# Patient Record
Sex: Female | Born: 1965 | Race: Black or African American | Hispanic: No | Marital: Single | Smoking: Never smoker
Health system: Southern US, Community
[De-identification: ages and names within clinical notes are randomized; demographics above are authoritative.]

## PROBLEM LIST (undated history)

## (undated) DIAGNOSIS — I10 Essential (primary) hypertension: Secondary | ICD-10-CM

---

## 1989-11-07 HISTORY — PX: BREAST LUMPECTOMY: SHX2

## 2002-11-07 HISTORY — PX: UTERINE FIBROID SURGERY: SHX826

## 2019-04-16 ENCOUNTER — Other Ambulatory Visit: Payer: Self-pay

## 2019-04-16 ENCOUNTER — Ambulatory Visit (HOSPITAL_COMMUNITY)
Admission: EM | Admit: 2019-04-16 | Discharge: 2019-04-16 | Disposition: A | Discharge status: Home / Self Care | Payer: Self-pay | Attending: Internal Medicine | Admitting: Internal Medicine

## 2019-04-16 ENCOUNTER — Encounter (HOSPITAL_COMMUNITY): Payer: Self-pay | Admitting: Emergency Medicine

## 2019-04-16 DIAGNOSIS — R42 Dizziness and giddiness: Secondary | ICD-10-CM | POA: Insufficient documentation

## 2019-04-16 DIAGNOSIS — J302 Other seasonal allergic rhinitis: Secondary | ICD-10-CM | POA: Insufficient documentation

## 2019-04-16 HISTORY — DX: Essential (primary) hypertension: I10

## 2019-04-16 LAB — CBC
HCT: 40.5 % (ref 36.0–46.0)
Hemoglobin: 13.9 g/dL (ref 12.0–15.0)
MCH: 30.6 pg (ref 26.0–34.0)
MCHC: 34.3 g/dL (ref 30.0–36.0)
MCV: 89.2 fL (ref 80.0–100.0)
Platelets: 231 10*3/uL (ref 150–400)
RBC: 4.54 MIL/uL (ref 3.87–5.11)
RDW: 13 % (ref 11.5–15.5)
WBC: 6.3 10*3/uL (ref 4.0–10.5)
nRBC: 0 % (ref 0.0–0.2)

## 2019-04-16 LAB — BASIC METABOLIC PANEL
Anion gap: 10 (ref 5–15)
BUN: 10 mg/dL (ref 6–20)
CO2: 22 mmol/L (ref 22–32)
Calcium: 9.4 mg/dL (ref 8.9–10.3)
Chloride: 107 mmol/L (ref 98–111)
Creatinine, Ser: 0.86 mg/dL (ref 0.44–1.00)
GFR calc Af Amer: >60 mL/min (ref >60)
GFR calc non Af Amer: >60 mL/min (ref >60)
Glucose, Bld: 99 mg/dL (ref 70–99)
Potassium: 3.7 mmol/L (ref 3.5–5.1)
Sodium: 139 mmol/L (ref 135–145)

## 2019-04-16 LAB — TSH: TSH: 1.392 u[IU]/mL (ref 0.350–4.500)

## 2019-04-16 MED ORDER — MONTELUKAST SODIUM 10 MG PO TABS: 10.0000 mg | ORAL_TABLET | Freq: Every day | ORAL | 0 refills | Status: DC

## 2019-04-16 MED ORDER — GUAIFENESIN ER 600 MG PO TB12: 600.0000 mg | ORAL_TABLET | Freq: Two times a day (BID) | ORAL | 0 refills | Status: DC

## 2019-04-16 MED ORDER — FLUTICASONE PROPIONATE 50 MCG/ACT NA SUSP: 2.0000 | Freq: Every day | NASAL | 2 refills | Status: DC

## 2019-04-16 NOTE — ED Provider Notes (Signed)
[facility]    CSN: [id number] Arrival date & time: 04/16/19  1706     History   Chief Complaint Chief Complaint  Patient presents with  . Nasal Congestion    HPI Lori Acevedo is a 53 y.o. female history of hypertension comes to urgent care with complaints of increasing nasal congestion, fullness in both ears of several months duration.  Patient says her symptoms started several months ago and she seen different providers occluding ENT.  Patient has tried Flonase, course of antibiotics with no improvement in her symptoms.  She comes to urgent care today because of worsening symptoms over the past few days and is associated with some unsteadiness and dizziness.  No fever or chills.  No headaches.  No hearing problems.  Patient does not feel like she is spinning in the room or the room is spinning around her.  No nausea or vomiting.   Past Medical History:  Diagnosis Date  . Hypertension     There are no active problems to display for this patient.   History reviewed. No pertinent surgical history.  OB History   No obstetric history on file.      Home Medications    Prior to Admission medications   Medication Sig Start Date End Date Taking? Authorizing Provider  losartan (COZAAR) 25 MG tablet Take 25 mg by mouth daily.   Yes [provider]  fluticasone (FLONASE) 50 MCG/ACT nasal spray Place 2 sprays into both nostrils daily. 04/16/19   Merrilee JanskyLamptey, Euline Kimbler O, MD  guaiFENesin (MUCINEX) 600 MG 12 hr tablet Take 1 tablet (600 mg total) by mouth 2 (two) times daily. 04/16/19   Zoiey Christy, Britta MccreedyPhilip O, MD  montelukast (SINGULAIR) 10 MG tablet Take 1 tablet (10 mg total) by mouth at bedtime. 04/16/19   Norva Bowe, Britta MccreedyPhilip O, MD    Family History History reviewed. No pertinent family history.  Social History Social History   Tobacco Use  . Smoking status: Never Smoker  . Smokeless tobacco: Never Used  Substance Use Topics  . Alcohol use: Not Currently  . Drug  use: Never     Allergies   Patient has no known allergies.   Review of Systems Review of Systems   Physical Exam Triage Vital Signs ED Triage Vitals [04/16/19 1748]  Enc Vitals Group     BP 140/84     Pulse Rate 100     Resp 18     Temp 98.6 F (37 C)     Temp Source Oral     SpO2 100 %     Weight      Height      Head Circumference      Peak Flow      Pain Score 5     Pain Loc      Pain Edu?      Excl. in GC?    No data found.  Updated Vital Signs BP 140/84 (BP Location: Right Arm)   Pulse 100   Temp 98.6 F (37 C) (Oral)   Resp 18   SpO2 100%   Visual Acuity Right Eye Distance:   Left Eye Distance:   Bilateral Distance:    Right Eye Near:   Left Eye Near:    Bilateral Near:     Physical Exam Constitutional:      Appearance: Normal appearance. She is not ill-appearing.  HENT:     Right Ear: Tympanic membrane normal.     Left Ear: Tympanic membrane  normal.     Nose: Congestion present.     Mouth/Throat:     Pharynx: No oropharyngeal exudate or posterior oropharyngeal erythema.  Eyes:     Conjunctiva/sclera: Conjunctivae normal.  Neck:     Musculoskeletal: Normal range of motion.  Cardiovascular:     Rate and Rhythm: Normal rate and regular rhythm.     Pulses: Normal pulses.     Heart sounds: Normal heart sounds.  Pulmonary:     Effort: Pulmonary effort is normal.     Breath sounds: Normal breath sounds.  Abdominal:     General: Bowel sounds are normal. There is no distension.     Palpations: Abdomen is soft.     Tenderness: There is no abdominal tenderness. There is no guarding.  Musculoskeletal: Normal range of motion.        General: No swelling or deformity.  Skin:    General: Skin is warm and dry.     Capillary Refill: Capillary refill takes less than 2 seconds.     Coloration: Skin is not jaundiced.     Findings: No bruising.  Neurological:     General: No focal deficit present.     Mental Status: She is alert.  Psychiatric:         Mood and Affect: Mood normal.      UC Treatments / Results  Labs (all labs ordered are listed, but only abnormal results are displayed) Labs Reviewed  CBC  BASIC METABOLIC PANEL  TSH  VITAMIN D 25 HYDROXY (VIT D DEFICIENCY, FRACTURES)    EKG None  Radiology No results found.  Procedures Procedures (including critical care time)  Medications Ordered in UC Medications - No data to display  Initial Impression / Assessment and Plan / UC Course  I have reviewed the triage vital signs and the nursing notes.  Pertinent labs & imaging results that were available during my care of the patient were reviewed by me and considered in my medical decision making (see chart for details).     1.  Allergic rhinitis with vestibular dizziness: EKG showed normal sinus rhythm CBC, TSH were unremarkable Vitamin D levels are pending Patient is prescribed guaifenesin to help with nasal congestion Singulair, Flonase. Final Clinical Impressions(s) / UC Diagnoses   Final diagnoses:  Seasonal allergic rhinitis, unspecified trigger  Vestibular dizziness involving left inner ear   Discharge Instructions   None    ED Prescriptions    Medication Sig Dispense Auth. Provider   guaiFENesin (MUCINEX) 600 MG 12 hr tablet  (Status: Discontinued) Take 1 tablet (600 mg total) by mouth 2 (two) times daily. 30 tablet Eddison Searls, Myrene Galas, MD   fluticasone (FLONASE) 50 MCG/ACT nasal spray  (Status: Discontinued) Place 2 sprays into both nostrils daily. 16 g Alveta Quintela, Myrene Galas, MD   montelukast (SINGULAIR) 10 MG tablet  (Status: Discontinued) Take 1 tablet (10 mg total) by mouth at bedtime. 30 tablet Vianny Schraeder, Myrene Galas, MD   fluticasone (FLONASE) 50 MCG/ACT nasal spray Place 2 sprays into both nostrils daily. 16 g Chase Picket, MD   guaiFENesin (MUCINEX) 600 MG 12 hr tablet Take 1 tablet (600 mg total) by mouth 2 (two) times daily. 30 tablet Ancelmo Hunt, Myrene Galas, MD   montelukast (SINGULAIR) 10 MG  tablet Take 1 tablet (10 mg total) by mouth at bedtime. 30 tablet Tien Spooner, Myrene Galas, MD     Controlled Substance Prescriptions [place] Controlled Substance Registry consulted? No   Chase Picket, MD 04/16/19 [phone]

## 2019-04-16 NOTE — ED Triage Notes (Signed)
Pt sts sinus congestion and ear pain x months worse over last several days with some generalized weakness

## 2019-04-17 ENCOUNTER — Telehealth (HOSPITAL_COMMUNITY): Payer: Self-pay | Admitting: Emergency Medicine

## 2019-04-17 LAB — VITAMIN D 25 HYDROXY (VIT D DEFICIENCY, FRACTURES): Vit D, 25-Hydroxy: 26.4 ng/mL — ABNORMAL LOW (ref 30.0–100.0)

## 2019-04-17 NOTE — Telephone Encounter (Signed)
Normal labs, per dr. Lanny Acevedo pt okay to take otc vit d supplements, otherwise normal labs. Patient contacted and made aware of all results, all questions answered.

## 2019-12-30 ENCOUNTER — Encounter (INDEPENDENT_AMBULATORY_CARE_PROVIDER_SITE_OTHER): Payer: Self-pay | Admitting: Otolaryngology

## 2019-12-30 ENCOUNTER — Ambulatory Visit (INDEPENDENT_AMBULATORY_CARE_PROVIDER_SITE_OTHER): Payer: 59 | Admitting: Otolaryngology

## 2019-12-30 ENCOUNTER — Other Ambulatory Visit: Payer: Self-pay

## 2019-12-30 VITALS — Temp 97.7°F

## 2019-12-30 DIAGNOSIS — J3089 Other allergic rhinitis: Secondary | ICD-10-CM | POA: Diagnosis not present

## 2019-12-30 DIAGNOSIS — J342 Deviated nasal septum: Secondary | ICD-10-CM

## 2019-12-30 NOTE — Progress Notes (Signed)
HPI: Lori Acevedo is a 54 y.o. female who presents for evaluation of chronic sinus throat and ear complaints.  She complains of intermittent nasal congestion with mostly clear mucus discharge.  She is also complained of ear pressure as well as occasional throat symptoms.  The symptoms first started a year ago in May after the patient moved here from [place].  She did not have trouble with her nose and sinuses in [place]. She saw an allergist 2 weeks ago who prescribed Flonase, azelastine, Singulair and levocetirizine.  She apparently tested positive to several types of trees as well as fungus. She has previously seen ENT for sinus problems last year who recommended CT scan of the sinuses but she did not have insurance at that time.  Past Medical History:  Diagnosis Date  . Hypertension    No past surgical history on file. Social History   Socioeconomic History  . Marital status: Single    Spouse name: Not on file  . Number of children: Not on file  . Years of education: Not on file  . Highest education level: Not on file  Occupational History  . Not on file  Tobacco Use  . Smoking status: Never Smoker  . Smokeless tobacco: Never Used  Substance and Sexual Activity  . Alcohol use: Not Currently  . Drug use: Never  . Sexual activity: Not on file  Other Topics Concern  . Not on file  Social History Narrative  . Not on file   Social Determinants of Health   Financial Resource Strain:   . Difficulty of Paying Living Expenses: Not on file  Food Insecurity:   . Worried About Charity fundraiser in the Last Year: Not on file  . Ran Out of Food in the Last Year: Not on file  Transportation Needs:   . Lack of Transportation (Medical): Not on file  . Lack of Transportation (Non-Medical): Not on file  Physical Activity:   . Days of Exercise per Week: Not on file  . Minutes of Exercise per Session: Not on file  Stress:   . Feeling of Stress : Not on file  Social  Connections:   . Frequency of Communication with Friends and Family: Not on file  . Frequency of Social Gatherings with Friends and Family: Not on file  . Attends Religious Services: Not on file  . Active Member of Clubs or Organizations: Not on file  . Attends Archivist Meetings: Not on file  . Marital Status: Not on file   No family history on file. No Known Allergies Prior to Admission medications   Medication Sig Start Date End Date Taking? Authorizing Provider  fluticasone (FLONASE) 50 MCG/ACT nasal spray Place 2 sprays into both nostrils daily. 04/16/19   Chase Picket, MD  guaiFENesin (MUCINEX) 600 MG 12 hr tablet Take 1 tablet (600 mg total) by mouth 2 (two) times daily. 04/16/19   Chase Picket, MD  losartan (COZAAR) 25 MG tablet Take 25 mg by mouth daily.    [provider]  montelukast (SINGULAIR) 10 MG tablet Take 1 tablet (10 mg total) by mouth at bedtime. 04/16/19   LampteyMyrene Galas, MD     Positive ROS: Otherwise negative  All other systems have been reviewed and were otherwise negative with the exception of those mentioned in the HPI and as above.  Physical Exam: Constitutional: Alert, well-appearing, no acute distress Ears: External ears without lesions or tenderness. Ear canals are  clear bilaterally with intact, clear TMs.  Nasal: External nose without lesions. Septum with mild to moderate deflection..  Moderate mucosal swelling bilaterally right side worse than left today.  After decongesting the nose no nasal polyps noted.  Both middle meatus regions were clear. Oral: Lips and gums without lesions. Tongue and palate mucosa without lesions. Posterior oropharynx clear. Neck: No palpable adenopathy or masses Respiratory: Breathing comfortably  Skin: No facial/neck lesions or rash noted.  Procedures  Assessment: Chronic rhinitis Allergic rhinitis Septal deviation with turbinate per trophy.  Plan: She will continue with the prescribed  medications by the allergist. We will plan on scheduling a CT scan of the sinuses and have her follow-up following the CT scan of the sinuses to review this and possibly discuss surgical options.  Narda Bonds, MD

## 2019-12-31 ENCOUNTER — Other Ambulatory Visit (INDEPENDENT_AMBULATORY_CARE_PROVIDER_SITE_OTHER): Payer: Self-pay

## 2019-12-31 DIAGNOSIS — J329 Chronic sinusitis, unspecified: Secondary | ICD-10-CM

## 2020-01-16 ENCOUNTER — Other Ambulatory Visit: Payer: Self-pay

## 2020-01-16 ENCOUNTER — Ambulatory Visit
Admission: RE | Admit: 2020-01-16 | Discharge: 2020-01-16 | Disposition: A | Discharge status: Home / Self Care | Payer: 59 | Source: Ambulatory Visit | Attending: Otolaryngology | Admitting: Otolaryngology

## 2020-01-16 DIAGNOSIS — J329 Chronic sinusitis, unspecified: Secondary | ICD-10-CM

## 2020-01-30 ENCOUNTER — Ambulatory Visit (INDEPENDENT_AMBULATORY_CARE_PROVIDER_SITE_OTHER): Payer: 59 | Admitting: Otolaryngology

## 2020-01-30 ENCOUNTER — Other Ambulatory Visit: Payer: Self-pay

## 2020-01-30 VITALS — Temp 98.1°F

## 2020-01-30 DIAGNOSIS — J310 Chronic rhinitis: Secondary | ICD-10-CM

## 2020-01-30 NOTE — Progress Notes (Signed)
HPI: Lori Acevedo is a 54 y.o. female who returns today for evaluation of sinus complaints.  She complains mostly of pressure around her nose.  She presents today having had a CT scan of her sinuses.  I reviewed this with her in the office today and this showed clear paranasal sinuses with no significant sinus disease.  She does have history of allergies.  And uses Flonase and Singulair..  Past Medical History:  Diagnosis Date  . Hypertension    No past surgical history on file. Social History   Socioeconomic History  . Marital status: Single    Spouse name: Not on file  . Number of children: Not on file  . Years of education: Not on file  . Highest education level: Not on file  Occupational History  . Not on file  Tobacco Use  . Smoking status: Never Smoker  . Smokeless tobacco: Never Used  Substance and Sexual Activity  . Alcohol use: Not Currently  . Drug use: Never  . Sexual activity: Not on file  Other Topics Concern  . Not on file  Social History Narrative  . Not on file   Social Determinants of Health   Financial Resource Strain:   . Difficulty of Paying Living Expenses:   Food Insecurity:   . Worried About Charity fundraiser in the Last Year:   . Arboriculturist in the Last Year:   Transportation Needs:   . Film/video editor (Medical):   Marland Kitchen Lack of Transportation (Non-Medical):   Physical Activity:   . Days of Exercise per Week:   . Minutes of Exercise per Session:   Stress:   . Feeling of Stress :   Social Connections:   . Frequency of Communication with Friends and Family:   . Frequency of Social Gatherings with Friends and Family:   . Attends Religious Services:   . Active Member of Clubs or Organizations:   . Attends Archivist Meetings:   Marland Kitchen Marital Status:    No family history on file. No Known Allergies Prior to Admission medications   Medication Sig Start Date End Date Taking? Authorizing Provider  fluticasone (FLONASE) 50  MCG/ACT nasal spray Place 2 sprays into both nostrils daily. 04/16/19  Yes Lamptey, Myrene Galas, MD  guaiFENesin (MUCINEX) 600 MG 12 hr tablet Take 1 tablet (600 mg total) by mouth 2 (two) times daily. 04/16/19  Yes Lamptey, Myrene Galas, MD  losartan (COZAAR) 25 MG tablet Take 25 mg by mouth daily.   Yes [provider]  montelukast (SINGULAIR) 10 MG tablet Take 1 tablet (10 mg total) by mouth at bedtime. 04/16/19  Yes Lamptey, Myrene Galas, MD     Positive ROS: Otherwise negative  All other systems have been reviewed and were otherwise negative with the exception of those mentioned in the HPI and as above.  Physical Exam: Constitutional: Alert, well-appearing, no acute distress Ears: External ears without lesions or tenderness. Ear canals are clear bilaterally with intact, clear TMs bilaterally. Nasal: External nose without lesions. Septum midline..  She has mild to moderate rhinitis with clear mucus discharge within the nasal cavity moderate swelling.  Both middle meatus regions were clear with no evidence of purulent discharge. Oral: Lips and gums without lesions.  She has some minimal yellow coating on her dorsum of the tongue.  She complains that this sometimes feels like sandpaper.. Posterior oropharynx clear.  Palate mucosa was clear. Neck: No palpable adenopathy or masses Respiratory: Breathing  comfortably  Skin: No facial/neck lesions or rash noted.  Procedures  Assessment: Chronic rhinitis.  No evidence of sinusitis on clinical exam or CT scan.  Plan: Recommended continue use of her allergy medication as well as regular use of Flonase 2 sprays each nostril at night.  Saline rinse as needed. For the "dry" tongue complaints recommended oral hygiene with gargling with Listerine or scope and eating yogurt 2 or 3 times a week as this helps with normal oral flora.  Presently no evidence of active infection.   Narda Bonds, MD

## 2020-08-20 ENCOUNTER — Ambulatory Visit (INDEPENDENT_AMBULATORY_CARE_PROVIDER_SITE_OTHER): Payer: 59 | Admitting: Nurse Practitioner

## 2020-08-20 ENCOUNTER — Other Ambulatory Visit: Payer: Self-pay

## 2020-08-20 ENCOUNTER — Encounter: Payer: Self-pay | Admitting: Nurse Practitioner

## 2020-08-20 ENCOUNTER — Other Ambulatory Visit (INDEPENDENT_AMBULATORY_CARE_PROVIDER_SITE_OTHER): Payer: 59

## 2020-08-20 VITALS — BP 110/80 | HR 85 | Ht 68.0 in | Wt 208.0 lb

## 2020-08-20 DIAGNOSIS — K6289 Other specified diseases of anus and rectum: Secondary | ICD-10-CM | POA: Diagnosis not present

## 2020-08-20 DIAGNOSIS — D649 Anemia, unspecified: Secondary | ICD-10-CM

## 2020-08-20 DIAGNOSIS — Z853 Personal history of malignant neoplasm of breast: Secondary | ICD-10-CM

## 2020-08-20 DIAGNOSIS — Z860100 Personal history of colon polyps, unspecified: Secondary | ICD-10-CM

## 2020-08-20 LAB — CBC WITH DIFFERENTIAL/PLATELET
Basophils Absolute: 0 10*3/uL (ref 0.0–0.1)
Basophils Relative: 0.6 % (ref 0.0–3.0)
Eosinophils Absolute: 0.1 10*3/uL (ref 0.0–0.7)
Eosinophils Relative: 1.2 % (ref 0.0–5.0)
HCT: 43 % (ref 36.0–46.0)
Hemoglobin: 14.6 g/dL (ref 12.0–15.0)
Lymphocytes Relative: 33.5 % (ref 12.0–46.0)
Lymphs Abs: 1.8 10*3/uL (ref 0.7–4.0)
MCHC: 34 g/dL (ref 30.0–36.0)
MCV: 89.4 fl (ref 78.0–100.0)
Monocytes Absolute: 0.4 10*3/uL (ref 0.1–1.0)
Monocytes Relative: 7.6 % (ref 3.0–12.0)
Neutro Abs: 3 10*3/uL (ref 1.4–7.7)
Neutrophils Relative %: 57.1 % (ref 43.0–77.0)
Platelets: 206 10*3/uL (ref 150.0–400.0)
RBC: 4.81 Mil/uL (ref 3.87–5.11)
RDW: 14.2 % (ref 11.5–15.5)
WBC: 5.2 10*3/uL (ref 4.0–10.5)

## 2020-08-20 LAB — COMPREHENSIVE METABOLIC PANEL
ALT: 15 U/L (ref 0–35)
AST: 20 U/L (ref 0–37)
Albumin: 4.4 g/dL (ref 3.5–5.2)
Alkaline Phosphatase: 71 U/L (ref 39–117)
BUN: 7 mg/dL (ref 6–23)
CO2: 27 mEq/L (ref 19–32)
Calcium: 9.9 mg/dL (ref 8.4–10.5)
Chloride: 105 mEq/L (ref 96–112)
Creatinine, Ser: 0.76 mg/dL (ref 0.40–1.20)
GFR: 87.83 mL/min (ref >60.00)
Glucose, Bld: 97 mg/dL (ref 70–99)
Potassium: 3.9 mEq/L (ref 3.5–5.1)
Sodium: 140 mEq/L (ref 135–145)
Total Bilirubin: 0.7 mg/dL (ref 0.2–1.2)
Total Protein: 7.9 g/dL (ref 6.0–8.3)

## 2020-08-20 LAB — FERRITIN: Ferritin: 319.3 ng/mL — ABNORMAL HIGH (ref 10.0–291.0)

## 2020-08-20 LAB — IRON: Iron: 94 ug/dL (ref 42–145)

## 2020-08-20 NOTE — Progress Notes (Signed)
Reviewed and agree with management plans. ? ?Wayne Wicklund L. Paymon Rosensteel, MD, MPH  ?

## 2020-08-20 NOTE — Progress Notes (Signed)
08/20/2020 Lori Acevedo [id number] 11/30/1965   CHIEF COMPLAINT: Rectal pain   HISTORY OF PRESENT ILLNESS:   Lori Acevedo is a 54 year old female with a past medical history of hypertension, breast cancer right lumpectomy, chemo and radiation 1991 and colon polyps.  She complains of having shooting pain up into the rectum which radiates to the front pelvic area which occurs every few days for the past 2 months. When the rectal pain occurs she freezes in position and she eventually lays down in her bed and the pain will go away in approximately 30 minutes. She is passing a green black solid stool since she elected to take OTC iron since July 2021. She reported having a remote history of anemia and she decided to take iron to possibly improve her energy level. No rectal bleeding or melena. She was seen by her gynecologist 07/2020. She reported a pelvic exam and pelvic sonogram were normal and she was advised to schedule a GI evaluation. She reported having 2 colonoscopies in the past. The first colonoscopy possibly 10 years ago was reported as normal. Her most recent colonoscopy done about 5 years ago in [place] showed one or two polyps which she stated were not removed. A cousin possibly had colon cancer. She report having night sweats since 2019. No weight loss. No GERD symptoms. No other complaints today.   Past Medical History:  Diagnosis Date  . 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 She has one son. Past marijuana use in college. Four glasses of wine on the weekends.   Family History: Mother died 54 lung cancer, MI, smoker. Father died age 77 lung cancer. She has one brother and 5 sisters. Her youngest sister 9 breast cancer. Paternal grandmother ? cancer.  Paternal grandmother and grandfather DM. Cousin possibly had colon cancer in his 65's.   No Known Allergies    Outpatient Encounter Medications as of 08/20/2020  Medication  Sig  . fluticasone (FLONASE) 50 MCG/ACT nasal spray Place 2 sprays into both nostrils daily.  Marland Kitchen guaiFENesin (MUCINEX) 600 MG 12 hr tablet Take 1 tablet (600 mg total) by mouth 2 (two) times daily.  Marland Kitchen losartan (COZAAR) 25 MG tablet Take 25 mg by mouth daily.  . montelukast (SINGULAIR) 10 MG tablet Take 1 tablet (10 mg total) by mouth at bedtime.   No facility-administered encounter medications on file as of 08/20/2020.    REVIEW OF SYSTEMS:  Gen: + night sweats 10/2018. No weight loss or  Fever.  CV: Denies chest pain, palpitations or edema. Resp: Denies cough, shortness of breath of hemoptysis.  GI: Denies heartburn, dysphagia, stomach or lower abdominal pain. No diarrhea or constipation.  GU : Denies urinary burning, blood in urine, increased urinary frequency or incontinence. MS: Denies joint pain, muscles aches or weakness. Derm: Denies rash, itchiness, skin lesions or unhealing ulcers. Psych: Denies depression, anxiety. memory issues.  Heme: Denies bruising, bleeding. Neuro:  Denies headaches, dizziness or paresthesias. Endo:  Denies any problems with DM, thyroid or adrenal function.  PHYSICAL EXAM: BP 110/80   Pulse 85   Ht 5\' 8"  (1.727 m)   Wt 208 lb (94.3 kg)   SpO2 98%   BMI 31.63 kg/m   General: Well developed 54 year old female in no acute distress. Head: Normocephalic and atraumatic. Eyes:  Sclerae non-icteric, conjunctive pink. Ears: Normal auditory acuity. Mouth: Dentition intact. No ulcers or lesions.  Neck: Supple,  no lymphadenopathy or thyromegaly.  Lungs: Clear bilaterally to auscultation without wheezes, crackles or rhonchi. Heart: Regular rate and rhythm. No murmur, rub or gallop appreciated.  Abdomen: Soft, nontender, non distended. No masses. No hepatosplenomegaly. Normoactive bowel sounds x 4 quadrants.  Rectal: No external hemorrhoids. Internal hemorrhoids without prolapse. Anterior anal tenderness without fissure or mass. No stool or blood in the rectal  vault.  Marchelle Folks CMA present during exam.  Musculoskeletal: Symmetrical with no gross deformities. Skin: Warm and dry. No rash or lesions on visible extremities. Extremities: No edema. Neurological: Alert oriented x 4, no focal deficits.  Psychological:  Alert and cooperative. Normal mood and affect.  ASSESSMENT AND PLAN:  75. 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 Questionable history of colon polyps.  -Colonoscopy benefits and risks discussed including risk with sedation, risk of bleeding, perforation and infection  -Miralax Q HS to empty rectum -Patient to call office if rectal pain worsens   2. Patient reports history of anemia.  -Stop otc iron -CBC, iron panel  Further follow up to be determined after the above evaluation completed          CC:  No ref. provider found

## 2020-08-20 NOTE — Patient Instructions (Addendum)
If you are age 55 or older, your body mass index should be between 23-30. Your Body mass index is 31.63 kg/m. If this is out of the aforementioned range listed, please consider follow up with your Primary Care Provider.  If you are age 28 or younger, your body mass index should be between 19-25. Your Body mass index is 31.63 kg/m. If this is out of the aformentioned range listed, please consider follow up with your Primary Care Provider.   Your provider has requested that you go to the basement level for lab work before leaving today. Press "B" on the elevator. The lab is located at the first door on the left as you exit the elevator.  It has been recommended to you by your physician that you have a(n) Colonoscopy completed. Per your request, we did not schedule the procedure(s) today. Please contact our office at [phone] should you decide to have the procedure completed. You will be scheduled for a pre-visit and procedure at that time.  Use Miralax 17 grams in 8 ounces of water or juice at bedtime to increase your stool output, to empty the rectum.  Stop your oral Iron.  Call the office if your symptoms worsen.  Follow up pending at this time.

## 2020-08-21 LAB — IRON, TOTAL/TOTAL IRON BINDING CAP
%SAT: 28 % (calc) (ref 16–45)
Iron: 94 ug/dL (ref 45–160)
TIBC: 334 mcg/dL (calc) (ref 250–450)

## 2020-08-24 ENCOUNTER — Telehealth: Payer: Self-pay | Admitting: General Surgery

## 2020-08-24 NOTE — Telephone Encounter (Signed)
Notified the patient her labs were normal and she is to stop taking her iron as directed. The patient verbalized understanding,.

## 2020-08-24 NOTE — Telephone Encounter (Signed)
-----   Message from Arnaldo Natal, NP sent at 08/21/2020  6:12 AM EDT ----- French Ana, pls inform the patient her labs were normal. Stop the iron as recommended at the time of her office visit. Thx

## 2020-09-15 ENCOUNTER — Encounter (HOSPITAL_COMMUNITY): Payer: Self-pay | Admitting: Emergency Medicine

## 2020-09-15 ENCOUNTER — Other Ambulatory Visit: Payer: Self-pay

## 2020-09-15 ENCOUNTER — Ambulatory Visit (HOSPITAL_COMMUNITY)
Admission: EM | Admit: 2020-09-15 | Discharge: 2020-09-15 | Disposition: A | Discharge status: Home / Self Care | Payer: 59 | Attending: Physician Assistant | Admitting: Physician Assistant

## 2020-09-15 DIAGNOSIS — I10 Essential (primary) hypertension: Secondary | ICD-10-CM | POA: Diagnosis not present

## 2020-09-15 MED ORDER — LOSARTAN POTASSIUM 25 MG PO TABS: 25.0000 mg | ORAL_TABLET | Freq: Every day | ORAL | 2 refills | Status: DC

## 2020-09-15 NOTE — ED Triage Notes (Signed)
Pt presents for BP check and refill of BP medication. States took last pill yesterday.

## 2020-09-15 NOTE — ED Provider Notes (Signed)
[facility]    CSN: [id number] Arrival date & time: 09/15/20  1007      History   Chief Complaint Chief Complaint  Patient presents with  . Hypertension  . Medication Refill    HPI Lori Acevedo is a 54 y.o. female.   The history is provided by the patient. No language interpreter was used.  Hypertension This is a new problem. The problem occurs constantly. The problem has not changed since onset.Associated symptoms include headaches. Pertinent negatives include no chest pain. Nothing aggravates the symptoms. Nothing relieves the symptoms.  Medication Refill Pt request a refill of Losartan  Past Medical History:  Diagnosis Date  . Hypertension     Patient Active Problem List   Diagnosis Date Noted  . Rectal pain 08/20/2020    Past Surgical History:  Procedure Laterality Date  . BREAST LUMPECTOMY Right 1991   cancer  . CESAREAN SECTION  1990  . UTERINE FIBROID SURGERY  2004    OB History   No obstetric history on file.      Home Medications    Prior to Admission medications   Medication Sig Start Date End Date Taking? Authorizing Provider  fluticasone (FLONASE) 50 MCG/ACT nasal spray Place 2 sprays into both nostrils daily. Patient not taking: Reported on 08/20/2020 04/16/19   Merrilee Jansky, MD  levocetirizine (XYZAL) 5 MG tablet Take 5 mg by mouth daily. 08/15/20   [provider]  losartan (COZAAR) 25 MG tablet Take 1 tablet (25 mg total) by mouth daily. 09/15/20   Elson Areas, PA-C    Family History Family History  Problem Relation Age of Onset  . Colon cancer Neg Hx   . Pancreatic cancer Neg Hx   . Esophageal cancer Neg Hx     Social History Social History   Tobacco Use  . Smoking status: Never Smoker  . Smokeless tobacco: Never Used  Substance Use Topics  . Alcohol use: Not Currently  . Drug use: Never     Allergies   Patient has no known allergies.   Review of Systems Review of Systems    Cardiovascular: Negative for chest pain.  Neurological: Positive for headaches.  All other systems reviewed and are negative.    Physical Exam Triage Vital Signs ED Triage Vitals [09/15/20 1137]  Enc Vitals Group     BP 130/89     Pulse Rate 82     Resp 17     Temp 98.2 F (36.8 C)     Temp Source Oral     SpO2 96 %     Weight      Height      Head Circumference      Peak Flow      Pain Score 0     Pain Loc      Pain Edu?      Excl. in GC?    No data found.  Updated Vital Signs BP 130/89 (BP Location: Left Arm)   Pulse 82   Temp 98.2 F (36.8 C) (Oral)   Resp 17   SpO2 96%   Visual Acuity Right Eye Distance:   Left Eye Distance:   Bilateral Distance:    Right Eye Near:   Left Eye Near:    Bilateral Near:     Physical Exam Vitals and nursing note reviewed.  Constitutional:      Appearance: She is well-developed.  HENT:     Head: Normocephalic.  Cardiovascular:  Rate and Rhythm: Normal rate and regular rhythm.  Pulmonary:     Effort: Pulmonary effort is normal.  Abdominal:     General: There is no distension.  Musculoskeletal:        General: Normal range of motion.     Cervical back: Normal range of motion.  Neurological:     General: No focal deficit present.     Mental Status: She is alert and oriented to person, place, and time.  Psychiatric:        Mood and Affect: Mood normal.      UC Treatments / Results  Labs (all labs ordered are listed, but only abnormal results are displayed) Labs Reviewed - No data to display  EKG   Radiology No results found.  Procedures Procedures (including critical care time)  Medications Ordered in UC Medications - No data to display  Initial Impression / Assessment and Plan / UC Course  I have reviewed the triage vital signs and the nursing notes.  Pertinent labs & imaging results that were available during my care of the patient were reviewed by me and considered in my medical decision  making (see chart for details).     MDM:  Pt has an appointment with a primary care MD.  Final Clinical Impressions(s) / UC Diagnoses   Final diagnoses:  Hypertension, unspecified type     Discharge Instructions     Return if any problems.    ED Prescriptions    Medication Sig Dispense Auth. Provider   losartan (COZAAR) 25 MG tablet Take 1 tablet (25 mg total) by mouth daily. 30 tablet Elson Areas, [place]     PDMP not reviewed this encounter.  An After Visit Summary was printed and given to the patient.    Elson Areas, [place] 09/15/20 1218

## 2020-09-15 NOTE — Discharge Instructions (Signed)
Return if any problems.

## 2020-12-14 ENCOUNTER — Other Ambulatory Visit: Payer: Self-pay

## 2020-12-14 ENCOUNTER — Ambulatory Visit (HOSPITAL_COMMUNITY)
Admission: EM | Admit: 2020-12-14 | Discharge: 2020-12-14 | Disposition: A | Discharge status: Home / Self Care | Payer: 59 | Attending: Family Medicine | Admitting: Family Medicine

## 2020-12-14 ENCOUNTER — Encounter (HOSPITAL_COMMUNITY): Payer: Self-pay

## 2020-12-14 DIAGNOSIS — I10 Essential (primary) hypertension: Secondary | ICD-10-CM

## 2020-12-14 MED ORDER — LOSARTAN POTASSIUM 25 MG PO TABS: 25.0000 mg | ORAL_TABLET | Freq: Every day | ORAL | 1 refills | Status: DC

## 2020-12-14 NOTE — ED Triage Notes (Signed)
Pt requests refill of HTN Rx for losartan. States she has appt with a new PCP later this month.  Denies HA, dizziness, CP or other complaint. Does not have a BP machine at home for self-monitoring.

## 2020-12-15 NOTE — ED Provider Notes (Signed)
[facility]    CSN: [id number] Arrival date & time: 12/14/20  1910      History   Chief Complaint Chief Complaint  Patient presents with  . anti HTN Rx refill    HPI Lori Acevedo is a 55 y.o. female.   Here today for HTN f/u, has been taking low dose losartan daily and tolerating well. Does not have home BP machine to check readings. Denies CP, SOB, HAs, dizziness. Trying to eat low sodium diet and manage stress. Had PCP est care appt but it was postponed until next month.      Past Medical History:  Diagnosis Date  . Hypertension     Patient Active Problem List   Diagnosis Date Noted  . Rectal pain 08/20/2020    Past Surgical History:  Procedure Laterality Date  . BREAST LUMPECTOMY Right 1991   cancer  . CESAREAN SECTION  1990  . UTERINE FIBROID SURGERY  2004    OB History   No obstetric history on file.      Home Medications    Prior to Admission medications   Medication Sig Start Date End Date Taking? Authorizing Provider  fluticasone (FLONASE) 50 MCG/ACT nasal spray Place 2 sprays into both nostrils daily. Patient not taking: Reported on 08/20/2020 04/16/19   Merrilee Jansky, MD  levocetirizine (XYZAL) 5 MG tablet Take 5 mg by mouth daily. 08/15/20   [provider]  losartan (COZAAR) 25 MG tablet Take 1 tablet (25 mg total) by mouth daily. 12/14/20   Particia Nearing, PA-C    Family History Family History  Problem Relation Age of Onset  . Colon cancer Neg Hx   . Pancreatic cancer Neg Hx   . Esophageal cancer Neg Hx     Social History Social History   Tobacco Use  . Smoking status: Never Smoker  . Smokeless tobacco: Never Used  Substance Use Topics  . Alcohol use: Not Currently  . Drug use: Never     Allergies   Patient has no known allergies.   Review of Systems Review of Systems PER HPI    Physical Exam Triage Vital Signs ED Triage Vitals  Enc Vitals Group     BP 12/14/20 1956 (!) 137/92      Pulse Rate 12/14/20 1956 72     Resp 12/14/20 1956 18     Temp 12/14/20 1956 98.8 F (37.1 C)     Temp Source 12/14/20 1956 Oral     SpO2 12/14/20 1956 99 %     Weight --      Height --      Head Circumference --      Peak Flow --      Pain Score 12/14/20 1954 0     Pain Loc --      Pain Edu? --      Excl. in GC? --    No data found.  Updated Vital Signs BP (!) 137/92 (BP Location: Right Arm)   Pulse 72   Temp 98.8 F (37.1 C) (Oral)   Resp 18   SpO2 99%   Visual Acuity Right Eye Distance:   Left Eye Distance:   Bilateral Distance:    Right Eye Near:   Left Eye Near:    Bilateral Near:     Physical Exam Vitals and nursing note reviewed.  Constitutional:      Appearance: Normal appearance. She is not ill-appearing.  HENT:     Head: Atraumatic.  Eyes:     Extraocular Movements: Extraocular movements intact.     Conjunctiva/sclera: Conjunctivae normal.     Pupils: Pupils are equal, round, and reactive to light.  Cardiovascular:     Rate and Rhythm: Normal rate and regular rhythm.     Heart sounds: Normal heart sounds.  Pulmonary:     Effort: Pulmonary effort is normal.     Breath sounds: Normal breath sounds.  Abdominal:     General: Bowel sounds are normal. There is no distension.     Palpations: Abdomen is soft.     Tenderness: There is no abdominal tenderness. There is no guarding.  Musculoskeletal:        General: Normal range of motion.     Cervical back: Normal range of motion and neck supple.  Skin:    General: Skin is warm and dry.  Neurological:     General: No focal deficit present.     Mental Status: She is alert and oriented to person, place, and time.  Psychiatric:        Mood and Affect: Mood normal.        Thought Content: Thought content normal.        Judgment: Judgment normal.      UC Treatments / Results  Labs (all labs ordered are listed, but only abnormal results are displayed) Labs Reviewed - No data to  display  EKG   Radiology No results found.  Procedures Procedures (including critical care time)  Medications Ordered in UC Medications - No data to display  Initial Impression / Assessment and Plan / UC Course  I have reviewed the triage vital signs and the nursing notes.  Pertinent labs & imaging results that were available during my care of the patient were reviewed by me and considered in my medical decision making (see chart for details).     BP fairly well controlled based on reading available. She wishes to postpone labs until next month when she establishes care with new PCP. Refill sent, check if able and log home BP readings. DASH diet, exercise reviewed.   Final Clinical Impressions(s) / UC Diagnoses   Final diagnoses:  Essential hypertension   Discharge Instructions   None    ED Prescriptions    Medication Sig Dispense Auth. Provider   losartan (COZAAR) 25 MG tablet Take 1 tablet (25 mg total) by mouth daily. 30 tablet Particia Nearing, [place]     PDMP not reviewed this encounter.   Particia Nearing, [place] 12/15/20 1458

## 2021-04-30 ENCOUNTER — Other Ambulatory Visit: Payer: Self-pay | Admitting: Internal Medicine

## 2021-04-30 DIAGNOSIS — M5441 Lumbago with sciatica, right side: Secondary | ICD-10-CM

## 2021-05-01 ENCOUNTER — Other Ambulatory Visit: Payer: 59

## 2021-05-04 ENCOUNTER — Encounter (HOSPITAL_COMMUNITY): Payer: Self-pay | Admitting: Emergency Medicine

## 2021-05-04 ENCOUNTER — Ambulatory Visit (HOSPITAL_COMMUNITY)
Admission: EM | Admit: 2021-05-04 | Discharge: 2021-05-04 | Disposition: A | Discharge status: Home / Self Care | Payer: 59 | Attending: Family Medicine | Admitting: Family Medicine

## 2021-05-04 ENCOUNTER — Other Ambulatory Visit: Payer: Self-pay

## 2021-05-04 DIAGNOSIS — H6121 Impacted cerumen, right ear: Secondary | ICD-10-CM

## 2021-05-04 NOTE — Discharge Instructions (Addendum)
You may use an over the counter Debrox earwax removal kit. This will help soften the wax in your ear.

## 2021-05-04 NOTE — ED Triage Notes (Signed)
Pt is present today with right ear fullness. Pt states that her sx started x3 weeks ago.

## 2021-05-05 NOTE — ED Provider Notes (Signed)
  [place]   [id number] 05/04/21 Arrival Time: [place]:  1. Impacted cerumen of right ear    Manual removal of ear wax via curette; performed by me. Reports significant improvement of symptoms.    Discharge Instructions      You may use an over the counter Debrox earwax removal kit. This will help soften the wax in your ear.     May f/u here as needed.  Reviewed expectations re: course of current medical issues. Questions answered. Outlined signs and symptoms indicating need for more acute intervention. Patient verbalized understanding. After Visit Summary given.   SUBJECTIVE: History from: patient. Lori Acevedo is a 55 y.o. female who presents with complaint of right otalgia; without drainage; without bleeding. Onset gradual,  over past 2-3 w . Recent cold symptoms: none. Fever: no. Overall normal PO intake without n/v. Sick contacts: no. OTC treatment: none PTA.  Social History   Tobacco Use  Smoking Status Never  Smokeless Tobacco Never     OBJECTIVE:  Vitals:   05/04/21 1902  BP: 111/80  Pulse: 82  Resp: 17  Temp: 98.5 F (36.9 C)  TempSrc: Oral  SpO2: 99%     General appearance: alert; NAD Ear Canal: cerumen in R TM: right: normal Neck: supple without LAD Lungs: unlabored respirations, symmetrical air entry; cough: absent; no respiratory distress Skin: warm and dry Psychological: alert and cooperative; normal mood and affect  No Known Allergies  Past Medical History:  Diagnosis Date   Hypertension    Family History  Problem Relation Age of Onset   Colon cancer Neg Hx    Pancreatic cancer Neg Hx    Esophageal cancer Neg Hx    Social History   Socioeconomic History   Marital status: Single    Spouse name: Not on file   Number of children: Not on file   Years of education: Not on file   Highest education level: Not on file  Occupational History   Not on file  Tobacco Use   Smoking status: Never    Smokeless tobacco: Never  Substance and Sexual Activity   Alcohol use: Not Currently   Drug use: Never   Sexual activity: Not on file  Other Topics Concern   Not on file  Social History Narrative   Not on file   Social Determinants of Health   Financial Resource Strain: Not on file  Food Insecurity: Not on file  Transportation Needs: Not on file  Physical Activity: Not on file  Stress: Not on file  Social Connections: Not on file  Intimate Partner Violence: Not on file             Vanessa Kick, MD 05/05/21 [phone]

## 2021-05-07 ENCOUNTER — Ambulatory Visit
Admission: RE | Admit: 2021-05-07 | Discharge: 2021-05-07 | Disposition: A | Discharge status: Home / Self Care | Payer: 59 | Source: Ambulatory Visit | Attending: Internal Medicine | Admitting: Internal Medicine

## 2021-05-07 ENCOUNTER — Other Ambulatory Visit: Payer: Self-pay

## 2021-05-07 DIAGNOSIS — M5441 Lumbago with sciatica, right side: Secondary | ICD-10-CM

## 2021-06-06 ENCOUNTER — Encounter (HOSPITAL_COMMUNITY): Payer: Self-pay

## 2021-06-06 ENCOUNTER — Other Ambulatory Visit: Payer: Self-pay

## 2021-06-06 ENCOUNTER — Ambulatory Visit (HOSPITAL_COMMUNITY)
Admission: EM | Admit: 2021-06-06 | Discharge: 2021-06-06 | Disposition: A | Discharge status: Home / Self Care | Payer: 59 | Attending: Student | Admitting: Student

## 2021-06-06 DIAGNOSIS — H6121 Impacted cerumen, right ear: Secondary | ICD-10-CM

## 2021-06-06 NOTE — ED Triage Notes (Signed)
Pt present right ear pain and clogged. Symptom started a week ago. Pt states that she was recently seen the same issue a few weeks back and the symptoms has return

## 2021-06-06 NOTE — ED Provider Notes (Signed)
[facility]    CSN: [id number] Arrival date & time: 06/06/21  1712      History   Chief Complaint Chief Complaint  Patient presents with   Otalgia    HPI Lori Acevedo is a 55 y.o. female presenting with R cerumen impaction. Was removed using curette by another provider 3 weeks ago , patient states that the symptoms have returned in the last couple of days.  She has been using over-the-counter cerumen removal products with minimal improvement.  Endorses right muffled hearing, but denies ear pain, tinnitus, dizziness.  Denies recent URI, fever/chills.    HPI  Past Medical History:  Diagnosis Date   Hypertension     Patient Active Problem List   Diagnosis Date Noted   Rectal pain 08/20/2020    Past Surgical History:  Procedure Laterality Date   BREAST LUMPECTOMY Right 1991   cancer   CESAREAN SECTION  1990   UTERINE FIBROID SURGERY  2004    OB History   No obstetric history on file.      Home Medications    Prior to Admission medications   Medication Sig Start Date End Date Taking? Authorizing Provider  fluticasone (FLONASE) 50 MCG/ACT nasal spray Place 2 sprays into both nostrils daily. Patient not taking: Reported on 08/20/2020 04/16/19   Merrilee Jansky, MD  losartan (COZAAR) 25 MG tablet Take 1 tablet (25 mg total) by mouth daily. 12/14/20   Particia Nearing, PA-C  montelukast (SINGULAIR) 10 MG tablet Take 10 mg by mouth daily. 03/29/21   [provider]    Family History Family History  Problem Relation Age of Onset   Colon cancer Neg Hx    Pancreatic cancer Neg Hx    Esophageal cancer Neg Hx     Social History Social History   Tobacco Use   Smoking status: Never   Smokeless tobacco: Never  Substance Use Topics   Alcohol use: Not Currently   Drug use: Never     Allergies   Patient has no known allergies.   Review of Systems Review of Systems  HENT:         R cerumen impaction    Physical Exam Triage  Vital Signs ED Triage Vitals [06/06/21 1800]  Enc Vitals Group     BP 130/87     Pulse Rate 86     Resp 16     Temp 98.9 F (37.2 C)     Temp Source Oral     SpO2 97 %     Weight      Height      Head Circumference      Peak Flow      Pain Score 0     Pain Loc      Pain Edu?      Excl. in GC?    No data found.  Updated Vital Signs BP 130/87 (BP Location: Left Arm)   Pulse 86   Temp 98.9 F (37.2 C) (Oral)   Resp 16   SpO2 97%   Visual Acuity Right Eye Distance:   Left Eye Distance:   Bilateral Distance:    Right Eye Near:   Left Eye Near:    Bilateral Near:     Physical Exam Vitals reviewed.  Constitutional:      Appearance: Normal appearance. She is not ill-appearing.  HENT:     Head: Normocephalic and atraumatic.     Right Ear: Hearing, tympanic membrane, ear canal and  external ear normal. No swelling or tenderness. No middle ear effusion. There is impacted cerumen. No mastoid tenderness. Tympanic membrane is not injected, scarred, perforated, erythematous, retracted or bulging.     Left Ear: Hearing, tympanic membrane, ear canal and external ear normal. No swelling or tenderness.  No middle ear effusion. There is no impacted cerumen. No mastoid tenderness. Tympanic membrane is not injected, scarred, perforated, erythematous, retracted or bulging.     Ears:     Comments: Right tympanic membrane initially fully occluded by cerumen.  Following lavage, tympanic membrane appeared healthy and intact    Mouth/Throat:     Pharynx: Oropharynx is clear. No oropharyngeal exudate or posterior oropharyngeal erythema.  Cardiovascular:     Rate and Rhythm: Normal rate and regular rhythm.     Heart sounds: Normal heart sounds.  Pulmonary:     Effort: Pulmonary effort is normal.     Breath sounds: Normal breath sounds.  Lymphadenopathy:     Cervical: No cervical adenopathy.  Neurological:     General: No focal deficit present.     Mental Status: She is alert and oriented  to person, place, and time.  Psychiatric:        Mood and Affect: Mood normal.        Behavior: Behavior normal.        Thought Content: Thought content normal.        Judgment: Judgment normal.     UC Treatments / Results  Labs (all labs ordered are listed, but only abnormal results are displayed) Labs Reviewed - No data to display  EKG   Radiology No results found.  Procedures Procedures (including critical care time)  Medications Ordered in UC Medications - No data to display  Initial Impression / Assessment and Plan / UC Course  I have reviewed the triage vital signs and the nursing notes.  Pertinent labs & imaging results that were available during my care of the patient were reviewed by me and considered in my medical decision making (see chart for details).     This patient is a very pleasant 55 y.o. year old female presenting with R cerumen impaction. Removed by nurse using lavage. Patient tolerated this well. F/u with ENT if symptoms recur.   Final Clinical Impressions(s) / UC Diagnoses   Final diagnoses:  Impacted cerumen of right ear     Discharge Instructions      Continue over-the-counter remedies  Follow-up with ENT if symptoms persist.     ED Prescriptions   None    PDMP not reviewed this encounter.   Rhys Martini, PA-C 06/06/21 1827

## 2021-06-06 NOTE — Discharge Instructions (Addendum)
Continue over-the-counter remedies  Follow-up with ENT if symptoms persist.

## 2021-08-02 ENCOUNTER — Ambulatory Visit (INDEPENDENT_AMBULATORY_CARE_PROVIDER_SITE_OTHER): Payer: 59 | Admitting: Otolaryngology

## 2021-08-02 ENCOUNTER — Other Ambulatory Visit: Payer: Self-pay

## 2021-08-02 DIAGNOSIS — J310 Chronic rhinitis: Secondary | ICD-10-CM | POA: Diagnosis not present

## 2021-08-02 MED ORDER — TRIAMCINOLONE ACETONIDE 55 MCG/ACT NA AERO: 2.0000 | INHALATION_SPRAY | Freq: Every day | NASAL | 12 refills | Status: AC

## 2021-08-02 NOTE — Addendum Note (Signed)
Addended by: Drema Halon on: 08/02/2021 05:21 PM   Modules accepted: Orders

## 2021-08-02 NOTE — Progress Notes (Signed)
HPI: Lori Acevedo is a 55 y.o. female who returns today for evaluation of ears and sinus complaints.  She was having some ear discomfort but this is doing better presently.  She also complains of pressure in her nose.  Of note she had a sinus CT scan performed a little over a year ago that showed clear paranasal sinuses..  Past Medical History:  Diagnosis Date   Hypertension    Past Surgical History:  Procedure Laterality Date   BREAST LUMPECTOMY Right 1991   cancer   CESAREAN SECTION  1990   UTERINE FIBROID SURGERY  2004   Social History   Socioeconomic History   Marital status: Single    Spouse name: Not on file   Number of children: Not on file   Years of education: Not on file   Highest education level: Not on file  Occupational History   Not on file  Tobacco Use   Smoking status: Never   Smokeless tobacco: Never  Substance and Sexual Activity   Alcohol use: Not Currently   Drug use: Never   Sexual activity: Not on file  Other Topics Concern   Not on file  Social History Narrative   Not on file   Social Determinants of Health   Financial Resource Strain: Not on file  Food Insecurity: Not on file  Transportation Needs: Not on file  Physical Activity: Not on file  Stress: Not on file  Social Connections: Not on file   Family History  Problem Relation Age of Onset   Colon cancer Neg Hx    Pancreatic cancer Neg Hx    Esophageal cancer Neg Hx    No Known Allergies Prior to Admission medications   Medication Sig Start Date End Date Taking? Authorizing Provider  fluticasone (FLONASE) 50 MCG/ACT nasal spray Place 2 sprays into both nostrils daily. Patient not taking: Reported on 08/20/2020 04/16/19   Merrilee Jansky, MD  losartan (COZAAR) 25 MG tablet Take 1 tablet (25 mg total) by mouth daily. 12/14/20   Particia Nearing, PA-C  montelukast (SINGULAIR) 10 MG tablet Take 10 mg by mouth daily. 03/29/21   [provider]     Positive ROS:  Otherwise negative  All other systems have been reviewed and were otherwise negative with the exception of those mentioned in the HPI and as above.  Physical Exam: Constitutional: Alert, well-appearing, no acute distress Ears: External ears without lesions or tenderness. Ear canals are clear bilaterally with minimal wax buildup.  TMs were clear bilaterally.  Hearing screening with a tuning fork revealed symmetric hearing with good hearing in both ears. Nasal: External nose without lesions. Septum with slight deviation and moderate rhinitis.  After decongesting the nose both millimeters regions were clear with no signs of infection.  No polyps noted.. Clear nasal passages Oral: Lips and gums without lesions. Tongue and palate mucosa without lesions. Posterior oropharynx clear. Neck: No palpable adenopathy or masses Respiratory: Breathing comfortably  Skin: No facial/neck lesions or rash noted.  Procedures  Assessment: Chronic rhinitis  Plan: Would recommend regular use of Nasacort 2 sprays each nostril at night.  She has previously used the [employer].  Which did not seem to help. Also discussed with her concerning use of saline irrigations. She will follow-up as needed.   Narda Bonds, MD

## 2021-09-24 ENCOUNTER — Emergency Department (HOSPITAL_COMMUNITY)
Admission: EM | Admit: 2021-09-24 | Discharge: 2021-09-24 | Disposition: A | Discharge status: Home / Self Care | Payer: 59 | Attending: Emergency Medicine | Admitting: Emergency Medicine

## 2021-09-24 ENCOUNTER — Encounter (HOSPITAL_COMMUNITY): Payer: Self-pay | Admitting: Emergency Medicine

## 2021-09-24 ENCOUNTER — Other Ambulatory Visit: Payer: Self-pay

## 2021-09-24 DIAGNOSIS — M545 Low back pain, unspecified: Secondary | ICD-10-CM | POA: Diagnosis present

## 2021-09-24 DIAGNOSIS — Z79899 Other long term (current) drug therapy: Secondary | ICD-10-CM | POA: Insufficient documentation

## 2021-09-24 DIAGNOSIS — I10 Essential (primary) hypertension: Secondary | ICD-10-CM | POA: Diagnosis not present

## 2021-09-24 DIAGNOSIS — M5442 Lumbago with sciatica, left side: Secondary | ICD-10-CM | POA: Diagnosis not present

## 2021-09-24 MED ORDER — PREDNISONE 20 MG PO TABS: 40.0000 mg | ORAL_TABLET | Freq: Every day | ORAL | 0 refills | Status: AC

## 2021-09-24 MED ORDER — KETOROLAC TROMETHAMINE 15 MG/ML IJ SOLN
15.0000 mg | Freq: Once | INTRAMUSCULAR | Status: AC
  Administered 2021-09-24: 15 mg via INTRAMUSCULAR
  Filled 2021-09-24: qty 1

## 2021-09-24 MED ORDER — LIDOCAINE 5 % EX PTCH
1.0000 | MEDICATED_PATCH | CUTANEOUS | Status: DC
  Administered 2021-09-24: 1 via TRANSDERMAL
  Filled 2021-09-24: qty 1

## 2021-09-24 MED ORDER — LIDOCAINE 4 % EX PTCH: 1.0000 | MEDICATED_PATCH | Freq: Two times a day (BID) | CUTANEOUS | 0 refills | Status: DC | PRN

## 2021-09-24 NOTE — Discharge Instructions (Addendum)
You came to the emergency department today to be evaluated for your left lower back pain.  Your physical exam was reassuring.  You received the medication Toradol in the emergency department.  I have given you prescription for lidocaine patches and prednisone.  Please take these as prescribed.  Please follow-up with your orthopedic provider.  Starting tomorrow please take Ibuprofen (Advil, motrin) and Tylenol (acetaminophen) to relieve your pain.    You may take up to 600 MG (3 pills) of normal strength ibuprofen every 8 hours as needed.   You make take tylenol, up to 1,000 mg (two extra strength pills) every 8 hours as needed.   It is safe to take ibuprofen and tylenol at the same time as they work differently.   Do not take more than 3,000 mg tylenol in a 24 hour period (not more than one dose every 8 hours.  Please check all medication labels as many medications such as pain and cold medications may contain tylenol.  Do not drink alcohol while taking these medications.  Do not take other NSAID'S while taking ibuprofen (such as aleve or naproxen).  Please take ibuprofen with food to decrease stomach upset.  Get help right away if: You are not able to control when you urinate or have bowel movements (incontinence). You have: Weakness in your lower back, pelvis, buttocks, or legs that gets worse. Redness or swelling of your back. A burning sensation when you urinate.

## 2021-09-24 NOTE — ED Provider Notes (Signed)
[facility] EMERGENCY DEPARTMENT Provider Note   CSN: [id number] Arrival date & time: 09/24/21  1532     History Chief Complaint  Patient presents with   Back Pain    Lori Acevedo is a 55 y.o. female with a history of hypertension.  Presents to the emergency department with a chief complaint of left lumbar back pain.  Patient reports that her back pain started a few months prior.  Patient has been followed by orthopedic provider reports that she had an MRI and received multiple "injections,' into her back.  Patient reports pain has been worse over the last few weeks.  Pain is located to left lower back and radiates into left lower leg.   Patient rates pain 10/10 on the pain scale.  Pain is worse with touch and movement.  Patient denies any improvement in pain with Tylenol, ibuprofen, and prednisone.  Patient denies any recent falls or injuries.  Patient denies any fever, chills, numbness, weakness, saddle anesthesia, bowel or bladder dysfunction, IV drug use.  Patient endorses history of breast cancer in 1991 status post chemotherapy, radiation, and lumpectomy.   Back Pain Associated symptoms: no abdominal pain, no chest pain, no dysuria, no fever and no headaches       Past Medical History:  Diagnosis Date   Hypertension     Patient Active Problem List   Diagnosis Date Noted   Rectal pain 08/20/2020    Past Surgical History:  Procedure Laterality Date   BREAST LUMPECTOMY Right 1991   cancer   [place] SURGERY  2004     OB History   No obstetric history on file.     Family History  Problem Relation Age of Onset   Colon cancer Neg Hx    Pancreatic cancer Neg Hx    Esophageal cancer Neg Hx     Social History   Tobacco Use   Smoking status: Never   Smokeless tobacco: Never  Substance Use Topics   Alcohol use: Not Currently   Drug use: Never    Home Medications Prior to Admission medications    Medication Sig Start Date End Date Taking? Authorizing Provider  fluticasone (FLONASE) 50 MCG/ACT nasal spray Place 2 sprays into both nostrils daily. Patient not taking: Reported on 08/20/2020 04/16/19   Chase Picket, MD  losartan (COZAAR) 25 MG tablet Take 1 tablet (25 mg total) by mouth daily. 12/14/20   Volney American, PA-C  montelukast (SINGULAIR) 10 MG tablet Take 10 mg by mouth daily. 03/29/21   [provider]  triamcinolone (NASACORT) 55 MCG/ACT AERO nasal inhaler Place 2 sprays into the nose daily. 2 sprays each nostril at night 08/02/21   Rozetta Nunnery, MD    Allergies    Patient has no known allergies.  Review of Systems   Review of Systems  Constitutional:  Negative for chills and fever.  Eyes:  Negative for visual disturbance.  Respiratory:  Negative for shortness of breath.   Cardiovascular:  Negative for chest pain.  Gastrointestinal:  Negative for abdominal pain, nausea and vomiting.  Genitourinary:  Negative for decreased urine volume, difficulty urinating, dysuria, enuresis, flank pain, frequency, hematuria, urgency, vaginal bleeding, vaginal discharge and vaginal pain.  Musculoskeletal:  Positive for back pain. Negative for neck pain.  Skin:  Negative for color change and rash.  Neurological:  Negative for dizziness, syncope, light-headedness and headaches.  Psychiatric/Behavioral:  Negative for confusion.    Physical  Exam Updated Vital Signs BP (!) 153/109 (BP Location: Right Arm)   Pulse 87   Temp 98.3 F (36.8 C)   Resp 16   SpO2 99%   Physical Exam Vitals and nursing note reviewed.  Constitutional:      General: She is not in acute distress.    Appearance: She is not ill-appearing, toxic-appearing or diaphoretic.  HENT:     Head: Normocephalic.  Eyes:     General: No scleral icterus.       Right eye: No discharge.        Left eye: No discharge.  Cardiovascular:     Rate and Rhythm: Normal rate.  Pulmonary:     Effort:  Pulmonary effort is normal.  Musculoskeletal:     Cervical back: Normal.     Thoracic back: No swelling, edema, deformity, signs of trauma, lacerations, spasms, tenderness or bony tenderness.     Lumbar back: Tenderness present. No swelling, edema, deformity, signs of trauma, lacerations, spasms or bony tenderness. Positive left straight leg raise test. Negative right straight leg raise test.     Comments: No midline tenderness or deformity to the cervical, thoracic, or lumbar spine.  Patient has tenderness to left lumbar paraspinous muscles.  Positive left straight leg raise.  Skin:    General: Skin is warm and dry.  Neurological:     General: No focal deficit present.     Mental Status: She is alert and oriented to person, place, and time.     GCS: GCS eye subscore is 4. GCS verbal subscore is 5. GCS motor subscore is 6.     Cranial Nerves: No dysarthria or facial asymmetry.     Comments: +5 strength to bilateral upper and lower extremities.  Grip strength equal.  Sensation to light touch intact to bilateral upper and lower extremities.  Patient able to stand and ambulate  Psychiatric:        Behavior: Behavior is cooperative.    ED Results / Procedures / Treatments   Labs (all labs ordered are listed, but only abnormal results are displayed) Labs Reviewed - No data to display  EKG None  Radiology No results found.  Procedures Procedures   Medications Ordered in ED Medications  ketorolac (TORADOL) 15 MG/ML injection 15 mg (has no administration in time range)  lidocaine (LIDODERM) 5 % 1 patch (1 patch Transdermal Patch Applied 09/24/21 1814)    ED Course  I have reviewed the triage vital signs and the nursing notes.  Pertinent labs & imaging results that were available during my care of the patient were reviewed by me and considered in my medical decision making (see chart for details).    MDM Rules/Calculators/A&P                           Alert 55 year old female  in no acute distress, nontoxic-appearing.  Presents to ED with chief complaint of left lumbar back pain with radiation to left lower leg.  Patient has history of the same and is currently being followed by orthopedic provider.  Patient reports that pain has been worse over the last few weeks.  States that she completed prednisone taper 2 weeks prior.  Patient denies any numbness, weakness, bowel or bladder dysfunction, saddle anesthesia.  +5 strength to bilateral upper and lower extremities.  Sensation to light touch intact to bilateral upper and lower extremities.  Low suspicion for cauda equina syndrome at this time.  Patient  afebrile with no history of IV drug use.  Suspicion for osteomyelitis or epidural abscess at this time.  Pain is reproducible with touch.  Suspect possible sciatica.  We will give patient Toradol injection, lidocaine patch, and short course of prednisone.  Patient advised to follow-up with her orthopedic provider.  Patient denies any blood thinner use, pregnancy, or history of kidney dysfunction.  Discussed results, findings, treatment and follow up. Patient advised of return precautions. Patient verbalized understanding and agreed with plan.   Final Clinical Impression(s) / ED Diagnoses Final diagnoses:  Acute left-sided low back pain with left-sided sciatica    Rx / DC Orders ED Discharge Orders          Ordered    Lidocaine (HM LIDOCAINE PATCH) 4 % PTCH  Every 12 hours PRN        09/24/21 1801    predniSONE (DELTASONE) 20 MG tablet  Daily        09/24/21 1801             Dyann Ruddle 09/24/21 1820    Milton Ferguson, MD 09/24/21 2212

## 2021-09-24 NOTE — ED Triage Notes (Signed)
Pt presents with low back pain, worse on the left, and radiation down left leg to foot.  Pt states she has seen a doc for this and got an injection that did not help. Has f/u appt but not until December.  Pt reports having this same pain years ago that did respond to injection at the doc office and resolved.

## 2021-10-22 IMAGING — MR MR THORACIC SPINE W/O CM
4 of 6 series · 18 of 48 positions shown · non-contrast
Comparison: None.

CLINICAL DATA: Low back pain radiating down to left leg

EXAM:
MRI THORACIC SPINE WITHOUT CONTRAST
TECHNIQUE: Multiplanar, multisequence MR imaging of the thoracic spine was
performed. No intravenous contrast was administered.

[Series 2: T1 · sagittal · 3.0mm · 1.06mm/px · 3 of 11 slices shown]
[im 1/11]
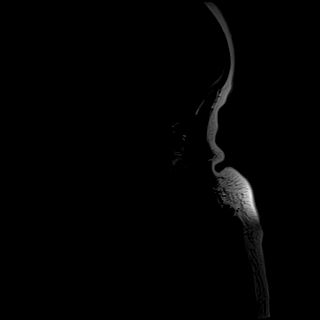
[im 7/11]
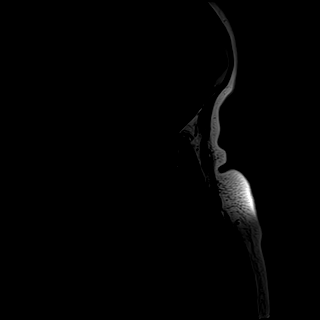
[im 11/11]
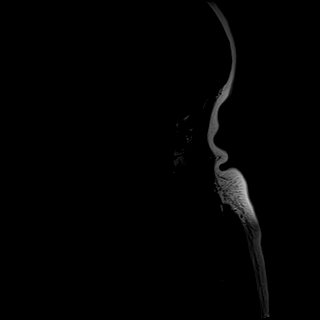

[Series 4: T2 · sagittal · 4.0mm · 0.50mm/px · 6 of 18 slices shown (1 of 3)]
[im 1/18]
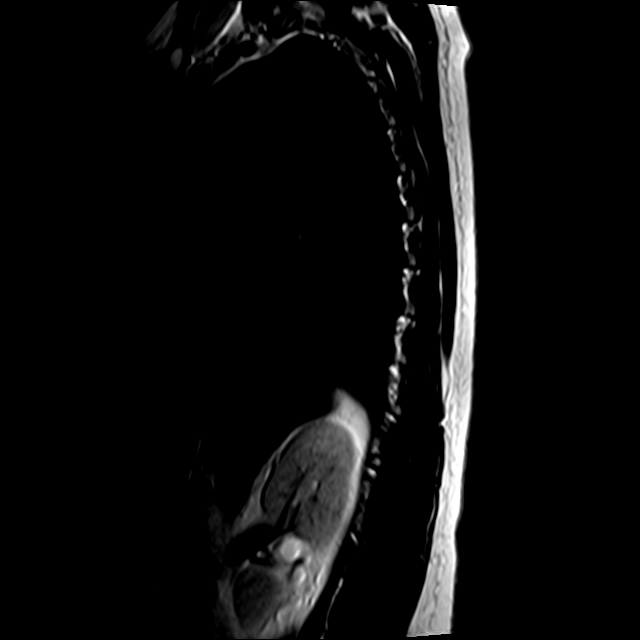
[im 4/18]
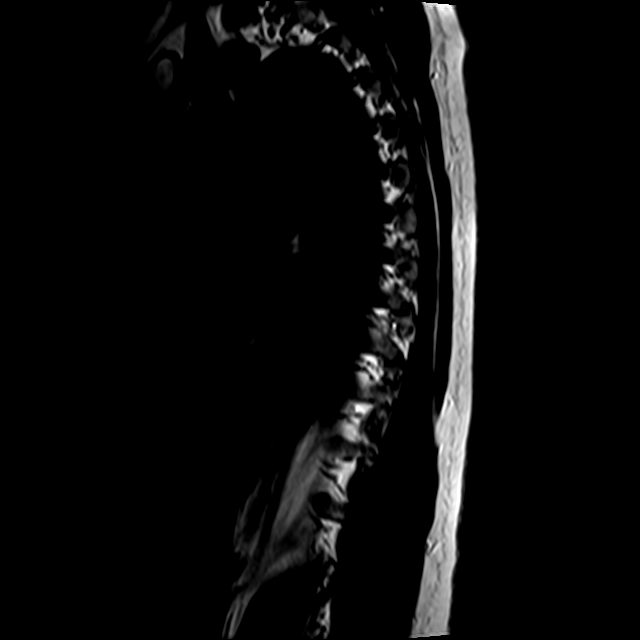
[im 7/18]
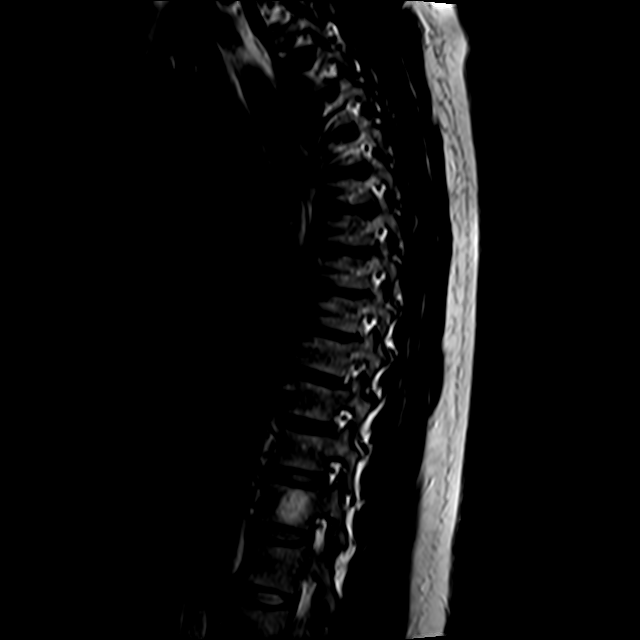
[im 11/18]
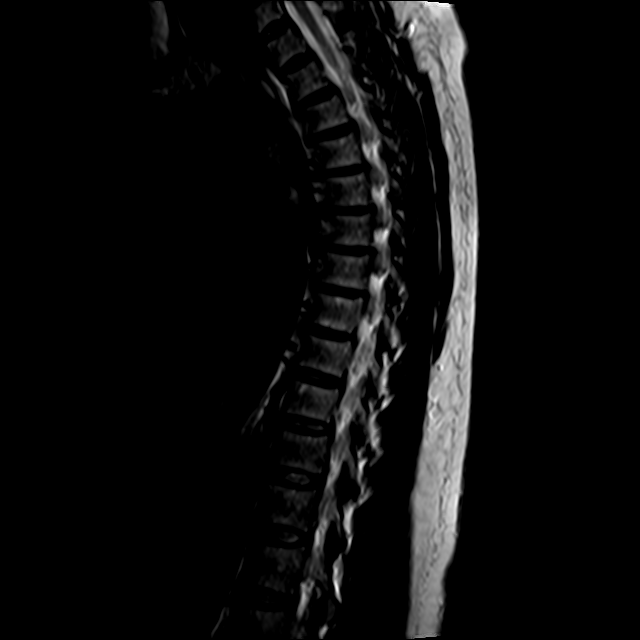
[im 14/18]
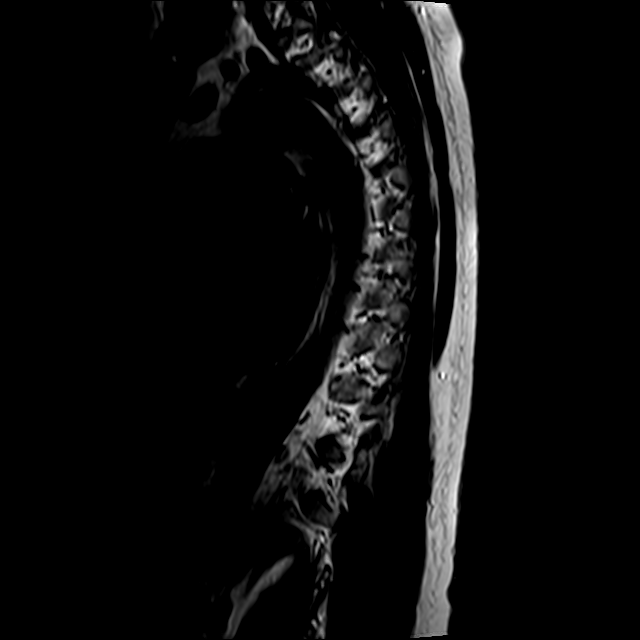
[im 18/18]
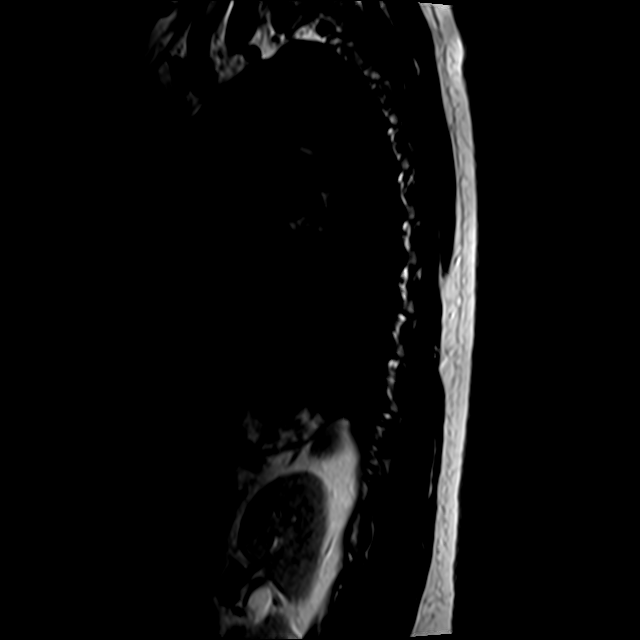

[Series 7: T2 · axial · 4.0mm · 0.39mm/px · z∈[-286,-106]mm · 6 of 39 slices shown (2 of 3)]
[im 1/39]
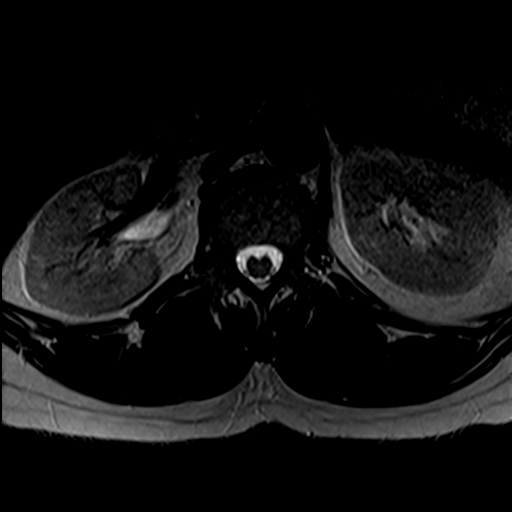
[im 7/39]
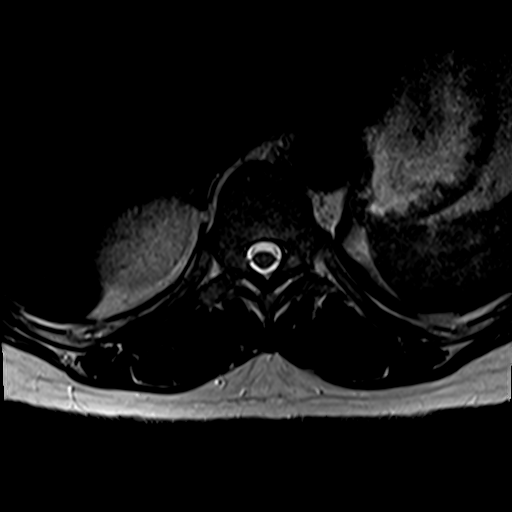
[im 13/39]
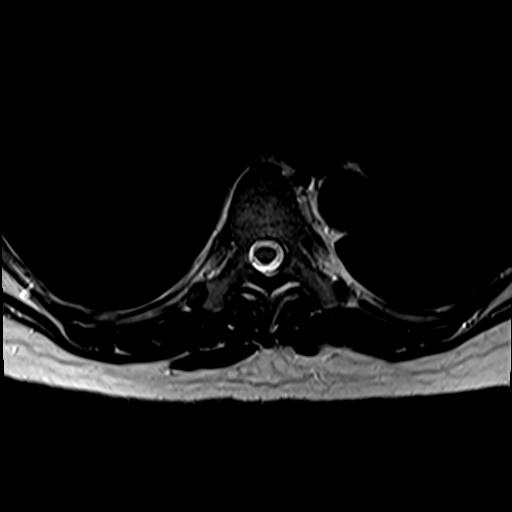
[im 16/39]
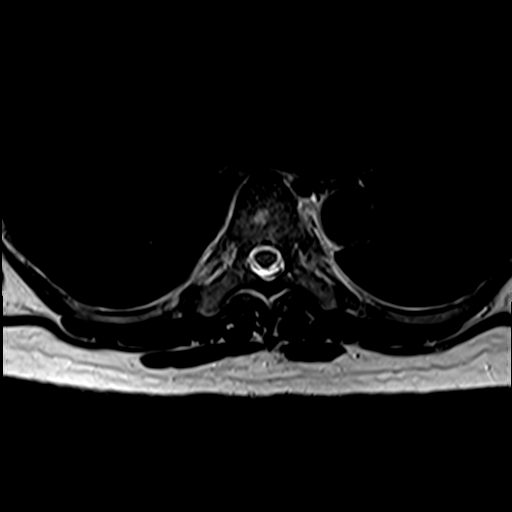
[im 20/39]
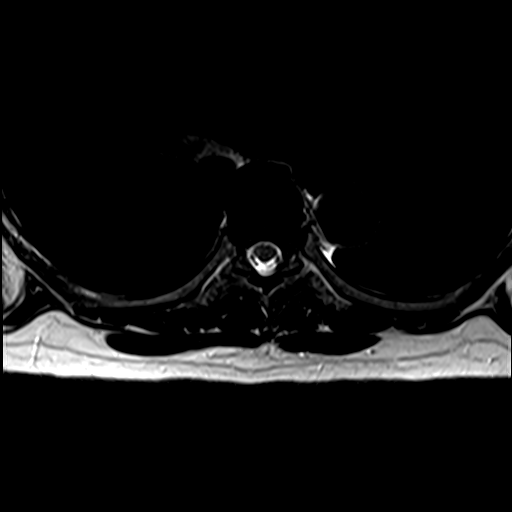
[im 32/39]
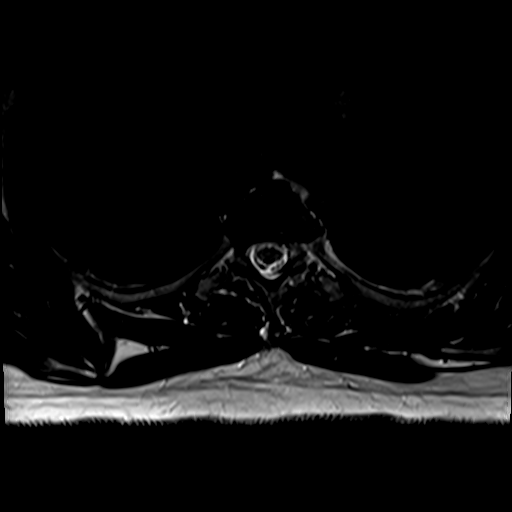

[Series 8: T2 · axial · 4.0mm · 0.39mm/px · z∈[-228,-106]mm · 3 of 39 slices shown (3 of 3)]
[im 7/39]
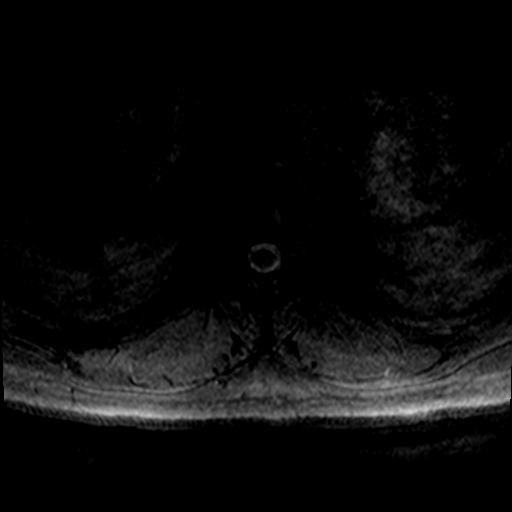
[im 20/39]
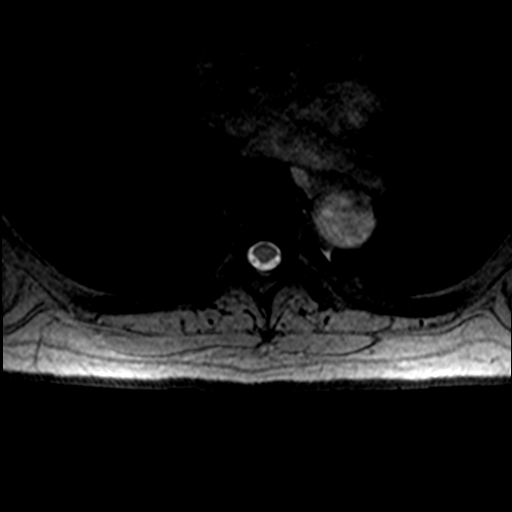
[im 32/39]
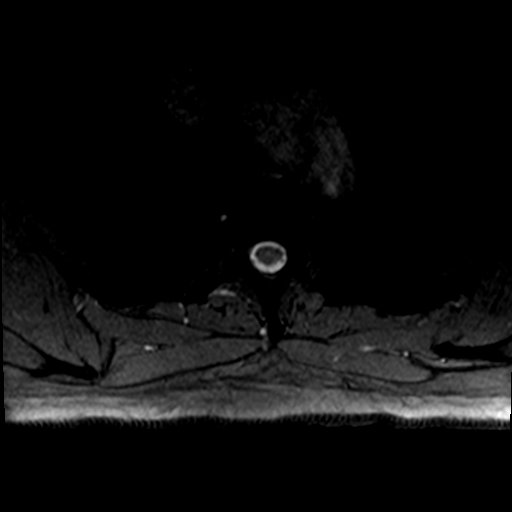

[18 of 48 positions shown; findings below may reference images not displayed]

FINDINGS: Alignment:  No significant listhesis.

Vertebrae: Vertebral body heights are maintained. There is minor
degenerative endplate irregularity. No marrow edema. No suspicious
osseous lesion.

Cord:  No abnormal signal.

Paraspinal and other soft tissues: Unremarkable.

Disc levels: There are minor multilevel disc bulges, for example
from T2-T3 through T7-T8. Minor facet arthropathy. There is no canal
or foraminal stenosis at any level.
IMPRESSION: Minor degenerative changes without stenosis.

## 2021-11-17 ENCOUNTER — Ambulatory Visit (HOSPITAL_COMMUNITY)
Admission: EM | Admit: 2021-11-17 | Discharge: 2021-11-17 | Disposition: A | Discharge status: Home / Self Care | Payer: 59 | Attending: Internal Medicine | Admitting: Internal Medicine

## 2021-11-17 ENCOUNTER — Encounter (HOSPITAL_COMMUNITY): Payer: Self-pay

## 2021-11-17 ENCOUNTER — Other Ambulatory Visit: Payer: Self-pay

## 2021-11-17 DIAGNOSIS — H6121 Impacted cerumen, right ear: Secondary | ICD-10-CM | POA: Diagnosis not present

## 2021-11-17 NOTE — ED Triage Notes (Signed)
Pt c/o rt ear fullness and can't hear out of it since Friday. States using OTC ear drops with no relief.

## 2021-11-17 NOTE — Discharge Instructions (Addendum)
I do not see any evidence of ear infection on your exam.  Your symptoms were likely due to earwax impaction.  Use over-the-counter drops daily as needed.  Please return for any worsening or continued symptoms.

## 2021-11-17 NOTE — ED Provider Notes (Signed)
[facility]    CSN: [id number] Arrival date & time: 11/17/21  1608      History   Chief Complaint Chief Complaint  Patient presents with   Ear Fullness    HPI Lori Acevedo is a 56 y.o. female  Ear fullness since last week. Unrelieved with cleaning. Tried OTC drops today with no relief.  No recent fever, chills, no congestion    Past Medical History:  Diagnosis Date   Hypertension     Patient Active Problem List   Diagnosis Date Noted   Rectal pain 08/20/2020    Past Surgical History:  Procedure Laterality Date   BREAST LUMPECTOMY Right 1991   cancer   [place] SURGERY  2004    OB History   No obstetric history on file.      Home Medications    Prior to Admission medications   Medication Sig Start Date End Date Taking? Authorizing Provider  fluticasone (FLONASE) 50 MCG/ACT nasal spray Place 2 sprays into both nostrils daily. Patient not taking: Reported on 08/20/2020 04/16/19   Chase Picket, MD  Lidocaine (HM LIDOCAINE PATCH) 4 % PTCH Apply 1 patch topically every 12 (twelve) hours as needed. 09/24/21   Loni Beckwith, PA-C  losartan (COZAAR) 25 MG tablet Take 1 tablet (25 mg total) by mouth daily. 12/14/20   Volney American, PA-C  montelukast (SINGULAIR) 10 MG tablet Take 10 mg by mouth daily. 03/29/21   [provider]  triamcinolone (NASACORT) 55 MCG/ACT AERO nasal inhaler Place 2 sprays into the nose daily. 2 sprays each nostril at night 08/02/21   Rozetta Nunnery, MD    Family History Family History  Problem Relation Age of Onset   Colon cancer Neg Hx    Pancreatic cancer Neg Hx    Esophageal cancer Neg Hx     Social History Social History   Tobacco Use   Smoking status: Never   Smokeless tobacco: Never  Substance Use Topics   Alcohol use: Not Currently   Drug use: Never     Allergies   Patient has no known allergies.   Review of Systems As stated in HPI  otherwise negative   Physical Exam Triage Vital Signs ED Triage Vitals  Enc Vitals Group     BP 11/17/21 1633 109/82     Pulse Rate 11/17/21 1633 86     Resp 11/17/21 1633 18     Temp 11/17/21 1633 98.4 F (36.9 C)     Temp Source 11/17/21 1633 Oral     SpO2 11/17/21 1633 99 %     Weight --      Height --      Head Circumference --      Peak Flow --      Pain Score 11/17/21 1634 0     Pain Loc --      Pain Edu? --      Excl. in [place]? --    No data found.  Updated Vital Signs BP 109/82 (BP Location: Left Arm)    Pulse 86    Temp 98.4 F (36.9 C) (Oral)    Resp 18    SpO2 99%   Visual Acuity Right Eye Distance:   Left Eye Distance:   Bilateral Distance:    Right Eye Near:   Left Eye Near:    Bilateral Near:     Physical Exam Constitutional:      General: She  is not in acute distress.    Appearance: Normal appearance. She is not ill-appearing or toxic-appearing.  HENT:     Right Ear: There is impacted cerumen.     Left Ear: Tympanic membrane and ear canal normal.     Ears:     Comments: Post irrigation, ear canal dry but with clear TM, no erythema, retraction or bulging    Nose: Nose normal. No congestion or rhinorrhea.  Eyes:     Extraocular Movements: Extraocular movements intact.  Skin:    General: Skin is warm and dry.  Neurological:     General: No focal deficit present.     Mental Status: She is alert and oriented to person, place, and time.  Psychiatric:        Mood and Affect: Mood normal.        Behavior: Behavior normal.     UC Treatments / Results  Labs (all labs ordered are listed, but only abnormal results are displayed) Labs Reviewed - No data to display  EKG   Radiology No results found.  Procedures Procedures (including critical care time)  Medications Ordered in UC Medications - No data to display  Initial Impression / Assessment and Plan / UC Course  I have reviewed the triage vital signs and the nursing notes.  Pertinent  labs & imaging results that were available during my care of the patient were reviewed by me and considered in my medical decision making (see chart for details).  Cerumen impaction, right ear -s/p irrigation -Debrox daily as needed -follow-up for persistent or worsening symptoms  Reviewed expections re: course of current medical issues. Questions answered. Outlined signs and symptoms indicating need for more acute intervention. Pt verbalized understanding. AVS given  Final Clinical Impressions(s) / UC Diagnoses   Final diagnoses:  Impacted cerumen of right ear     Discharge Instructions      I do not see any evidence of ear infection on your exam.  Your symptoms were likely due to earwax impaction.  Use over-the-counter drops daily as needed.  Please return for any worsening or continued symptoms.     ED Prescriptions   None    PDMP not reviewed this encounter.   Rudolpho Sevin, NP 11/17/21 1927

## 2022-05-17 ENCOUNTER — Emergency Department (HOSPITAL_COMMUNITY)
Admission: EM | Admit: 2022-05-17 | Discharge: 2022-05-18 | Disposition: A | Discharge status: Home / Self Care | Payer: 59 | Attending: Emergency Medicine | Admitting: Emergency Medicine

## 2022-05-17 DIAGNOSIS — I10 Essential (primary) hypertension: Secondary | ICD-10-CM | POA: Diagnosis not present

## 2022-05-17 DIAGNOSIS — Z79899 Other long term (current) drug therapy: Secondary | ICD-10-CM | POA: Diagnosis not present

## 2022-05-17 DIAGNOSIS — K2100 Gastro-esophageal reflux disease with esophagitis, without bleeding: Secondary | ICD-10-CM | POA: Insufficient documentation

## 2022-05-17 DIAGNOSIS — K219 Gastro-esophageal reflux disease without esophagitis: Secondary | ICD-10-CM

## 2022-05-17 DIAGNOSIS — R21 Rash and other nonspecific skin eruption: Secondary | ICD-10-CM | POA: Insufficient documentation

## 2022-05-17 DIAGNOSIS — R079 Chest pain, unspecified: Secondary | ICD-10-CM

## 2022-05-17 DIAGNOSIS — R0789 Other chest pain: Secondary | ICD-10-CM | POA: Diagnosis present

## 2022-05-17 NOTE — ED Provider Triage Note (Signed)
Emergency Medicine Provider Triage Evaluation Note  Lori Acevedo , a 56 y.o. female  was evaluated in triage.  Pt complains of chest pain intermittently for the past months.  States in sternal area and radiates into the throat.  States it feels like heartburn/gas.  Does feel worse after eating.  Denies cardiac hx.  Is not a smoker but exposed to heavy secondhand smoke.    Also reports hives.  Started 2 days ago on her arms and small area beneath right eye.  Unclear etiology, nothing new she can think of.  H as taken benadryl for this with some relief of itching.  Review of Systems  Positive: Chest pain, rash Negative: fever  Physical Exam  BP 136/87 (BP Location: Right Arm)   Pulse 81   Temp 98.7 F (37.1 C) (Oral)   Resp 18   SpO2 100%   Gen:   Awake, no distress   Resp:  Normal effort  MSK:   Moves extremities without difficulty  Other:    Medical Decision Making  Medically screening exam initiated at 11:57 PM.  Appropriate orders placed.  Lori Acevedo was informed that the remainder of the evaluation will be completed by another provider, this initial triage assessment does not replace that evaluation, and the importance of remaining in the ED until their evaluation is complete.  Chest pain, rash.  No prior cardiac hx.  Unclear rash etiology-- nothing new she can think of. No lip/tongue swelling, airway patent. EKG, labs, CXR.     Garlon Hatchet, PA-C 05/18/22 0000

## 2022-05-17 NOTE — ED Triage Notes (Signed)
Pt reports central chest pain x 1 month that radiates to her back, worse when she lays down; unrelieved with tums. Denies SOB. She also reports noticing a rash/hives to her arms and her lip.

## 2022-05-18 ENCOUNTER — Other Ambulatory Visit: Payer: Self-pay

## 2022-05-18 ENCOUNTER — Encounter (HOSPITAL_COMMUNITY): Payer: Self-pay

## 2022-05-18 ENCOUNTER — Emergency Department (HOSPITAL_COMMUNITY): Payer: 59

## 2022-05-18 LAB — CBC WITH DIFFERENTIAL/PLATELET
Abs Immature Granulocytes: 0.02 10*3/uL (ref 0.00–0.07)
Basophils Absolute: 0 10*3/uL (ref 0.0–0.1)
Basophils Relative: 0 %
Eosinophils Absolute: 0.1 10*3/uL (ref 0.0–0.5)
Eosinophils Relative: 1 %
HCT: 41.5 % (ref 36.0–46.0)
Hemoglobin: 14 g/dL (ref 12.0–15.0)
Immature Granulocytes: 0 %
Lymphocytes Relative: 31 %
Lymphs Abs: 2.2 10*3/uL (ref 0.7–4.0)
MCH: 30.5 pg (ref 26.0–34.0)
MCHC: 33.7 g/dL (ref 30.0–36.0)
MCV: 90.4 fL (ref 80.0–100.0)
Monocytes Absolute: 0.5 10*3/uL (ref 0.1–1.0)
Monocytes Relative: 7 %
Neutro Abs: 4.4 10*3/uL (ref 1.7–7.7)
Neutrophils Relative %: 61 %
Platelets: 227 10*3/uL (ref 150–400)
RBC: 4.59 MIL/uL (ref 3.87–5.11)
RDW: 13.2 % (ref 11.5–15.5)
WBC: 7.2 10*3/uL (ref 4.0–10.5)
nRBC: 0 % (ref 0.0–0.2)

## 2022-05-18 LAB — BASIC METABOLIC PANEL
Anion gap: 10 (ref 5–15)
BUN: 6 mg/dL (ref 6–20)
CO2: 23 mmol/L (ref 22–32)
Calcium: 9.7 mg/dL (ref 8.9–10.3)
Chloride: 105 mmol/L (ref 98–111)
Creatinine, Ser: 0.84 mg/dL (ref 0.44–1.00)
GFR, Estimated: >60 mL/min (ref >60)
Glucose, Bld: 97 mg/dL (ref 70–99)
Potassium: 3.7 mmol/L (ref 3.5–5.1)
Sodium: 138 mmol/L (ref 135–145)

## 2022-05-18 LAB — TROPONIN I (HIGH SENSITIVITY)
Troponin I (High Sensitivity): 6 ng/L (ref <18)
Troponin I (High Sensitivity): 6 ng/L (ref <18)

## 2022-05-18 MED ORDER — DIPHENHYDRAMINE HCL 25 MG PO CAPS
25.0000 mg | ORAL_CAPSULE | Freq: Once | ORAL | Status: AC
  Administered 2022-05-18: 25 mg via ORAL
  Filled 2022-05-18: qty 1

## 2022-05-18 MED ORDER — ALUM & MAG HYDROXIDE-SIMETH 200-200-20 MG/5ML PO SUSP
30.0000 mL | Freq: Once | ORAL | Status: AC
  Administered 2022-05-18: 30 mL via ORAL
  Filled 2022-05-18: qty 30

## 2022-05-18 MED ORDER — FAMOTIDINE 20 MG PO TABS: 20.0000 mg | ORAL_TABLET | Freq: Two times a day (BID) | ORAL | 1 refills | Status: AC

## 2022-05-18 MED ORDER — CETIRIZINE HCL 10 MG PO TABS: 10.0000 mg | ORAL_TABLET | Freq: Two times a day (BID) | ORAL | 0 refills | Status: AC

## 2022-05-18 MED ORDER — FAMOTIDINE 20 MG PO TABS
20.0000 mg | ORAL_TABLET | Freq: Once | ORAL | Status: AC
Start: 2022-05-18 — End: 2022-05-18
  Administered 2022-05-18: 20 mg via ORAL
  Filled 2022-05-18: qty 1

## 2022-05-18 NOTE — Discharge Instructions (Addendum)
I suspect that the chest pain you have been experiencing is due to acid reflux.  Your work-up does not suggest a problem with your heart.  To treat this you can take Pepcid twice daily before breakfast and dinner and to use the recommendations on foods to try and avoid.  Avoiding alcohol and NSAIDs can be helpful as well.  You can use Tums or Maalox for breakthrough symptoms.  To help treat your rash you can use the Pepcid prescribed as above for reflux and Zyrtec, both of these are histamine blockers to help with allergic reaction.  If this is still not improving you may need a steroid, but we would like to avoid this if possible because it can worsen acid reflux.  Follow-up with your primary care doctor if symptoms or not improving.  You can also follow-up with a GI doctor and allergy specialist.

## 2022-05-18 NOTE — ED Notes (Signed)
Pt verbalized understanding of d/c instructions, meds, and followup care. Denies questions. VSS, no distress noted. Steady gait to exit with all belongings.  ?

## 2022-05-18 NOTE — ED Notes (Signed)
Pt reports she took 2 benadryl tablets tonight at 9pm.

## 2022-05-19 ENCOUNTER — Ambulatory Visit (HOSPITAL_COMMUNITY)
Admission: EM | Admit: 2022-05-19 | Discharge: 2022-05-19 | Disposition: A | Discharge status: Home / Self Care | Payer: 59 | Attending: Internal Medicine | Admitting: Internal Medicine

## 2022-05-19 ENCOUNTER — Encounter (HOSPITAL_COMMUNITY): Payer: Self-pay | Admitting: Emergency Medicine

## 2022-05-19 DIAGNOSIS — T7840XA Allergy, unspecified, initial encounter: Secondary | ICD-10-CM | POA: Diagnosis not present

## 2022-05-19 DIAGNOSIS — I10 Essential (primary) hypertension: Secondary | ICD-10-CM

## 2022-05-19 DIAGNOSIS — L509 Urticaria, unspecified: Secondary | ICD-10-CM | POA: Diagnosis not present

## 2022-05-19 DIAGNOSIS — Z760 Encounter for issue of repeat prescription: Secondary | ICD-10-CM

## 2022-05-19 MED ORDER — PREDNISONE 20 MG PO TABS: 20.0000 mg | ORAL_TABLET | Freq: Every day | ORAL | 0 refills | Status: AC

## 2022-05-19 MED ORDER — LOSARTAN POTASSIUM 25 MG PO TABS: 25.0000 mg | ORAL_TABLET | Freq: Every day | ORAL | 1 refills | Status: AC

## 2022-05-19 MED ORDER — OLOPATADINE HCL 0.1 % OP SOLN
1.0000 [drp] | Freq: Two times a day (BID) | OPHTHALMIC | 1 refills | Status: AC
Start: 2022-05-19 — End: ?

## 2022-05-19 NOTE — ED Triage Notes (Signed)
Reports having hives that started this morning. Reports was seen at ED for swelling in lips and rash and was given medications

## 2022-05-19 NOTE — Discharge Instructions (Addendum)
Please take medications as prescribed If you have worsening symptoms please return to urgent care to be reevaluated Keep a diary of what you eat because it would help [place] identify if your symptoms are related to your diet.

## 2022-05-21 NOTE — ED Provider Notes (Signed)
[facility]    CSN: [id number] Arrival date & time: 05/19/22  0932      History   Chief Complaint Chief Complaint  Patient presents with   Urticaria    HPI Lori Acevedo is a 56 y.o. female comes to the urgent care for hives involving the torso and thighs.  Patient was seen for precordial chest pain in the emergency room yesterday.  She had the rash but it was not severe.  Rash is itchy.  She was prescribed Zyrtec and famotidine.  Patient said that the rash has worsened over the past 24 hours.  She denies any lip swelling, tongue swelling or throat tightening.  No shortness of breath or wheezing.  No chest tightness.  No nausea or vomiting.  Patient denies any changes in diet, cosmetics, body lotions or soap. Patient endorses seasonal allergies and currently complains of itchy eyes.  She is requesting blood pressure medication refill.  She ran out of her medications a few days ago. HPI  Past Medical History:  Diagnosis Date   Hypertension     Patient Active Problem List   Diagnosis Date Noted   Rectal pain 08/20/2020    Past Surgical History:  Procedure Laterality Date   BREAST LUMPECTOMY Right 1991   cancer   CESAREAN SECTION  1990   [place] FIBROID SURGERY  2004    OB History   No obstetric history on file.      Home Medications    Prior to Admission medications   Medication Sig Start Date End Date Taking? Authorizing Provider  olopatadine (PATANOL) 0.1 % ophthalmic solution Place 1 drop into both eyes 2 (two) times daily. 05/19/22  Yes Kailei Cowens, Britta Mccreedy, MD  predniSONE (DELTASONE) 20 MG tablet Take 1 tablet (20 mg total) by mouth daily for 5 days. 05/19/22 05/24/22 Yes Nyema Hachey, Britta Mccreedy, MD  cetirizine (ZYRTEC ALLERGY) 10 MG tablet Take 1 tablet (10 mg total) by mouth 2 (two) times daily. 05/18/22   Dartha Lodge, PA-C  famotidine (PEPCID) 20 MG tablet Take 1 tablet (20 mg total) by mouth 2 (two) times daily. 05/18/22   Dartha Lodge, PA-C   Lidocaine (HM LIDOCAINE PATCH) 4 % PTCH Apply 1 patch topically every 12 (twelve) hours as needed. 09/24/21   Haskel Schroeder, PA-C  losartan (COZAAR) 25 MG tablet Take 1 tablet (25 mg total) by mouth daily. 05/19/22   Aolani Piggott, Britta Mccreedy, MD  montelukast (SINGULAIR) 10 MG tablet Take 10 mg by mouth daily. 03/29/21   [provider]  triamcinolone (NASACORT) 55 MCG/ACT AERO nasal inhaler Place 2 sprays into the nose daily. 2 sprays each nostril at night 08/02/21   Drema Halon, MD    Family History Family History  Problem Relation Age of Onset   Colon cancer Neg Hx    Pancreatic cancer Neg Hx    Esophageal cancer Neg Hx     Social History Social History   Tobacco Use   Smoking status: Never   Smokeless tobacco: Never  Substance Use Topics   Alcohol use: Not Currently   Drug use: Never     Allergies   Patient has no known allergies.   Review of Systems Review of Systems  Gastrointestinal: Negative.   Skin:  Positive for color change and rash.  Neurological: Negative.   Psychiatric/Behavioral: Negative.       Physical Exam Triage Vital Signs ED Triage Vitals [05/19/22 1027]  Enc Vitals Group     BP Marland Kitchen)  152/103     Pulse Rate 81     Resp 17     Temp 98.1 F (36.7 C)     Temp Source Oral     SpO2 98 %     Weight      Height      Head Circumference      Peak Flow      Pain Score 10     Pain Loc      Pain Edu?      Excl. in [place]?    No data found.  Updated Vital Signs BP (!) 152/103 (BP Location: Left Arm) Comment: needs medication refill on HTn meds  Pulse 81   Temp 98.1 F (36.7 C) (Oral)   Resp 17   SpO2 98%   Visual Acuity Right Eye Distance:   Left Eye Distance:   Bilateral Distance:    Right Eye Near:   Left Eye Near:    Bilateral Near:     Physical Exam Vitals reviewed.  Constitutional:      Appearance: Normal appearance.  HENT:     Mouth/Throat:     Mouth: Mucous membranes are moist.     Pharynx: No posterior  oropharyngeal erythema.     Comments: No tongue swelling or lip swelling.  Uvula is not swollen. Cardiovascular:     Rate and Rhythm: Normal rate and regular rhythm.     Pulses: Normal pulses.     Heart sounds: Normal heart sounds.  Skin:    Comments: Urticarial rash over the chest and thighs.  Neurological:     Mental Status: She is alert.      UC Treatments / Results  Labs (all labs ordered are listed, but only abnormal results are displayed) Labs Reviewed - No data to display  EKG   Radiology No results found.  Procedures Procedures (including critical care time)  Medications Ordered in UC Medications - No data to display  Initial Impression / Assessment and Plan / UC Course  I have reviewed the triage vital signs and the nursing notes.  Pertinent labs & imaging results that were available during my care of the patient were reviewed by me and considered in my medical decision making (see chart for details).     1.  Urticarial rash: Continue famotidine and Zyrtec We will add prednisone 20 mg orally daily for 5 days Patanol eyedrops for itchy eyes.  2.  Hypertension, uncontrolled: Losartan refill sent to the pharmacy Patient denies any chest pain or chest pressure at this time.  No abdominal pain.  No headaches. Final Clinical Impressions(s) / UC Diagnoses   Final diagnoses:  Urticarial rash     Discharge Instructions      Please take medications as prescribed If you have worsening symptoms please return to urgent care to be reevaluated Keep a diary of what you eat because it would help [place] identify if your symptoms are related to your diet.   ED Prescriptions     Medication Sig Dispense Auth. Provider   predniSONE (DELTASONE) 20 MG tablet Take 1 tablet (20 mg total) by mouth daily for 5 days. 5 tablet Kaeden Mester, Britta Mccreedy, MD   losartan (COZAAR) 25 MG tablet Take 1 tablet (25 mg total) by mouth daily. 30 tablet Dilcia Rybarczyk, Britta Mccreedy, MD   olopatadine  (PATANOL) 0.1 % ophthalmic solution Place 1 drop into both eyes 2 (two) times daily. 5 mL Tamla Winkels, Britta Mccreedy, MD      PDMP not reviewed this  encounter.   Merrilee Jansky, MD 05/21/22 1025

## 2022-06-06 NOTE — ED Provider Notes (Signed)
[facility] EMERGENCY DEPARTMENT Provider Note   CSN: [id number] Arrival date & time: 05/17/22  2348     History  Chief Complaint  Patient presents with   Chest Pain   Rash    Lori Acevedo is a 56 y.o. female.  Lori Acevedo is a 56 y.o. female with history of hypertension, who presents to the ED for evaluation of chest pain.  She reports chest pain has been occurring intermittently over the past 2 months.  She describes it as a substernal chest pain that radiates into her throat.  Describes it as a pressure as well as some burning discomfort.  Worse after eating.  No associated shortness of breath, lightheadedness, syncope, diaphoresis, nausea or vomiting.  Currently reports mild discomfort.  No history of heart issues and no known family history.  Not a smoker but exposed to heavy secondhand smoke.  She also reports hives on her arms and around her right eye that have been present for 2 days.  She denies any known new medications, household products or foods.  Wonders if she got into something outdoors.  Has taken Benadryl which is provided some relief with itching.   The history is provided by the patient and medical records.  Chest Pain Associated symptoms: no abdominal pain, no cough, no fever, no nausea, no shortness of breath and no vomiting   Rash Associated symptoms: no abdominal pain, no fever, no nausea, no shortness of breath and not vomiting        Home Medications Prior to Admission medications   Medication Sig Start Date End Date Taking? Authorizing Provider  cetirizine (ZYRTEC ALLERGY) 10 MG tablet Take 1 tablet (10 mg total) by mouth 2 (two) times daily. 05/18/22  Yes Dartha Lodge, PA-C  famotidine (PEPCID) 20 MG tablet Take 1 tablet (20 mg total) by mouth 2 (two) times daily. 05/18/22  Yes Dartha Lodge, PA-C  Lidocaine (HM LIDOCAINE PATCH) 4 % PTCH Apply 1 patch topically every 12 (twelve) hours as needed. 09/24/21   Haskel Schroeder, PA-C  losartan (COZAAR) 25 MG tablet Take 1 tablet (25 mg total) by mouth daily. 05/19/22   Lamptey, Britta Mccreedy, MD  montelukast (SINGULAIR) 10 MG tablet Take 10 mg by mouth daily. 03/29/21   [provider]  olopatadine (PATANOL) 0.1 % ophthalmic solution Place 1 drop into both eyes 2 (two) times daily. 05/19/22   LampteyBritta Mccreedy, MD  triamcinolone (NASACORT) 55 MCG/ACT AERO nasal inhaler Place 2 sprays into the nose daily. 2 sprays each nostril at night 08/02/21   Drema Halon, MD      Allergies    Patient has no known allergies.    Review of Systems   Review of Systems  Constitutional:  Negative for chills and fever.  Respiratory:  Negative for cough and shortness of breath.   Cardiovascular:  Positive for chest pain.  Gastrointestinal:  Negative for abdominal pain, nausea and vomiting.  Skin:  Positive for rash.  Neurological:  Negative for syncope and light-headedness.    Physical Exam Updated Vital Signs BP (!) 162/102 (BP Location: Right Arm)   Pulse 60   Temp 98.1 F (36.7 C) (Oral)   Resp 16   Ht 5\' 10"  (1.778 m)   Wt 99.8 kg   SpO2 98%   BMI 31.57 kg/m  Physical Exam Vitals and nursing note reviewed.  Constitutional:      General: She is not in acute distress.  Appearance: Normal appearance. She is well-developed. She is not diaphoretic.  HENT:     Head: Normocephalic and atraumatic.  Eyes:     General:        Right eye: No discharge.        Left eye: No discharge.     Pupils: Pupils are equal, round, and reactive to light.  Cardiovascular:     Rate and Rhythm: Normal rate and regular rhythm.     Pulses: Normal pulses.          Radial pulses are 2+ on the right side and 2+ on the left side.       Dorsalis pedis pulses are 2+ on the right side and 2+ on the left side.     Heart sounds: Normal heart sounds.  Pulmonary:     Effort: Pulmonary effort is normal. No respiratory distress.     Breath sounds: Normal breath sounds. No  wheezing or rales.     Comments: Respirations equal and unlabored, patient able to speak in full sentences, lungs clear to auscultation bilaterally  Chest:     Chest wall: No tenderness.  Abdominal:     General: Bowel sounds are normal. There is no distension.     Palpations: Abdomen is soft. There is no mass.     Tenderness: There is no abdominal tenderness. There is no guarding.     Comments: Abdomen soft, nondistended, nontender to palpation in all quadrants without guarding or peritoneal signs  Musculoskeletal:        General: No deformity.     Cervical back: Neck supple.  Skin:    General: Skin is warm and dry.     Capillary Refill: Capillary refill takes less than 2 seconds.     Comments: Urticarial rash noted to bilateral upper extremities as well as the neck, chest and face.  No significant facial swelling, no associated vesicles, pustules or petechiae.  Neurological:     Mental Status: She is alert and oriented to person, place, and time.     Coordination: Coordination normal.     Comments: Speech is clear, able to follow commands Moves extremities without ataxia, coordination intact  Psychiatric:        Mood and Affect: Mood normal.        Behavior: Behavior normal.     ED Results / Procedures / Treatments   Labs (all labs ordered are listed, but only abnormal results are displayed) Labs Reviewed  CBC WITH DIFFERENTIAL/PLATELET  BASIC METABOLIC PANEL  TROPONIN I (HIGH SENSITIVITY)  TROPONIN I (HIGH SENSITIVITY)    EKG EKG Interpretation  Date/Time:  Tuesday May 17 2022 23:55:13 EDT Ventricular Rate:  77 PR Interval:  184 QRS Duration: 86 QT Interval:  376 QTC Calculation: 425 R Axis:   23 Text Interpretation: Normal sinus rhythm Abnormal ECG When compared with ECG of 16-Apr-2019 18:21, PREVIOUS ECG IS PRESENT No significant change since last tracing Confirmed by Pattricia Boss [phone]) on 05/18/2022 12:42:08 PM  Radiology DG Chest 2 View  Result Date:  05/18/2022 CLINICAL DATA:  Chest pain EXAM: CHEST - 2 VIEW COMPARISON:  None Available. FINDINGS: The heart size and mediastinal contours are within normal limits. Both lungs are clear. The visualized skeletal structures are unremarkable. IMPRESSION: Negative. Electronically Signed   By: Rolm Baptise M.D.   On: 05/18/2022 00:30     Procedures Procedures    Medications Ordered in ED Medications  alum & mag hydroxide-simeth (MAALOX/MYLANTA) 200-200-20 MG/5ML suspension 30 mL (30  mLs Oral Given 05/18/22 1429)  famotidine (PEPCID) tablet 20 mg (20 mg Oral Given 05/18/22 1428)  diphenhydrAMINE (BENADRYL) capsule 25 mg (25 mg Oral Given 05/18/22 1428)    ED Course/ Medical Decision Making/ A&P                           Medical Decision Making Risk OTC drugs.   Patient presents to the emergency department with chest pain and rash. Patient nontoxic appearing, in no apparent distress, vitals without significant abnormality. Fairly benign physical exam, urticarial rash noted to the upper body.  No associated facial swelling or signs of anaphylaxis.  DDX including but not limited to: ACS, pulmonary embolism, dissection, pneumothorax, pneumonia, arrhythmia, severe anemia, MSK, GERD, anxiety, abdominal process.  Chest pain seems to be more likely GERD especially given its intermittent nature, worse after eating and described as a burning sensation radiating into the throat. Suspect rash is likely an allergic reaction to something although unclear trigger.  Additional history obtained:  Chart & nursing note reviewed.    EKG: Sinus rhythm, no ischemic changes  Lab Tests:  I reviewed & interpreted labs including:  No leukocytosis, no electrolyte derangements and normal renal function, troponin negative x2  Imaging Studies ordered:  I ordered and viewed the following imaging, chest x-ray without active cardiopulmonary disease.  ED Course:  I ordered medications including Pepcid, Maalox, Benadryl  for GERD and allergic reaction  RE-EVAL: Chest pain resolved and hives improving with medication.  EKG without obvious acute ischemia, delta troponin negative, doubt ACS. Patient is low risk wells, PERC negative, doubt pulmonary embolism. Pain is not a tearing sensation, symmetric pulses, no widening of mediastinum on CXR, doubt dissection. Cardiac monitor reviewed, no notable arrhythmias or tachycardia   Based on patient's chief complaint, I considered admission might be necessary, however after reassuring ED workup feel patient is reasonable for discharge.   I discussed results, treatment plan, need for PCP follow-up, and return precautions with the patient. Provided opportunity for questions, patient confirmed understanding and is in agreement with plan. Will have pt use benadryl and pepcid to treat hives, would like to avoid steroids as it will likely worsen the GERD symptoms  Portions of this note were generated with Dragon dictation software. Dictation errors may occur despite best attempts at proofreading.         Final Clinical Impression(s) / ED Diagnoses Final diagnoses:  Chest pain, central  Gastroesophageal reflux disease, unspecified whether esophagitis present  Rash    Rx / DC Orders ED Discharge Orders          Ordered    famotidine (PEPCID) 20 MG tablet  2 times daily        05/18/22 1536    cetirizine (ZYRTEC ALLERGY) 10 MG tablet  2 times daily        05/18/22 1536              Jodi Geralds [place], [place] 06/06/22 1224    Gwyneth Sprout, MD 06/06/22 [phone]

## 2022-12-20 ENCOUNTER — Ambulatory Visit (INDEPENDENT_AMBULATORY_CARE_PROVIDER_SITE_OTHER): Payer: Self-pay | Admitting: Neurology

## 2022-12-20 DIAGNOSIS — R202 Paresthesia of skin: Secondary | ICD-10-CM

## 2022-12-23 NOTE — Progress Notes (Signed)
Appt was cancelled.  

## 2023-12-04 ENCOUNTER — Encounter (HOSPITAL_BASED_OUTPATIENT_CLINIC_OR_DEPARTMENT_OTHER): Payer: Self-pay | Admitting: Emergency Medicine

## 2023-12-04 ENCOUNTER — Other Ambulatory Visit: Payer: Self-pay

## 2023-12-04 ENCOUNTER — Emergency Department (HOSPITAL_BASED_OUTPATIENT_CLINIC_OR_DEPARTMENT_OTHER): Payer: Medicaid Other

## 2023-12-04 ENCOUNTER — Emergency Department (HOSPITAL_BASED_OUTPATIENT_CLINIC_OR_DEPARTMENT_OTHER)
Admission: EM | Admit: 2023-12-04 | Discharge: 2023-12-04 | Disposition: A | Discharge status: Home / Self Care | Payer: Medicaid Other | Attending: Emergency Medicine | Admitting: Emergency Medicine

## 2023-12-04 DIAGNOSIS — K760 Fatty (change of) liver, not elsewhere classified: Secondary | ICD-10-CM | POA: Insufficient documentation

## 2023-12-04 DIAGNOSIS — R1013 Epigastric pain: Secondary | ICD-10-CM | POA: Diagnosis present

## 2023-12-04 LAB — URINALYSIS, ROUTINE W REFLEX MICROSCOPIC
Bilirubin Urine: NEGATIVE
Glucose, UA: NEGATIVE mg/dL
Hgb urine dipstick: NEGATIVE
Ketones, ur: NEGATIVE mg/dL
Leukocytes,Ua: NEGATIVE
Nitrite: NEGATIVE
Protein, ur: NEGATIVE mg/dL
Specific Gravity, Urine: 1.021 (ref 1.005–1.030)
pH: 5.5 (ref 5.0–8.0)

## 2023-12-04 LAB — COMPREHENSIVE METABOLIC PANEL
ALT: 13 U/L (ref 0–44)
AST: 19 U/L (ref 15–41)
Albumin: 4.2 g/dL (ref 3.5–5.0)
Alkaline Phosphatase: 61 U/L (ref 38–126)
Anion gap: 7 (ref 5–15)
BUN: 11 mg/dL (ref 6–20)
CO2: 27 mmol/L (ref 22–32)
Calcium: 9.3 mg/dL (ref 8.9–10.3)
Chloride: 105 mmol/L (ref 98–111)
Creatinine, Ser: 0.77 mg/dL (ref 0.44–1.00)
GFR, Estimated: >60 mL/min (ref >60)
Glucose, Bld: 103 mg/dL — ABNORMAL HIGH (ref 70–99)
Potassium: 3.7 mmol/L (ref 3.5–5.1)
Sodium: 139 mmol/L (ref 135–145)
Total Bilirubin: 0.4 mg/dL (ref 0.0–1.2)
Total Protein: 7.2 g/dL (ref 6.5–8.1)

## 2023-12-04 LAB — CBC WITH DIFFERENTIAL/PLATELET
Abs Immature Granulocytes: 0.01 10*3/uL (ref 0.00–0.07)
Basophils Absolute: 0 10*3/uL (ref 0.0–0.1)
Basophils Relative: 0 %
Eosinophils Absolute: 0.1 10*3/uL (ref 0.0–0.5)
Eosinophils Relative: 2 %
HCT: 41 % (ref 36.0–46.0)
Hemoglobin: 13.8 g/dL (ref 12.0–15.0)
Immature Granulocytes: 0 %
Lymphocytes Relative: 39 %
Lymphs Abs: 1.9 10*3/uL (ref 0.7–4.0)
MCH: 30.3 pg (ref 26.0–34.0)
MCHC: 33.7 g/dL (ref 30.0–36.0)
MCV: 90.1 fL (ref 80.0–100.0)
Monocytes Absolute: 0.5 10*3/uL (ref 0.1–1.0)
Monocytes Relative: 9 %
Neutro Abs: 2.5 10*3/uL (ref 1.7–7.7)
Neutrophils Relative %: 50 %
Platelets: 232 10*3/uL (ref 150–400)
RBC: 4.55 MIL/uL (ref 3.87–5.11)
RDW: 12.8 % (ref 11.5–15.5)
WBC: 5 10*3/uL (ref 4.0–10.5)
nRBC: 0 % (ref 0.0–0.2)

## 2023-12-04 LAB — LIPASE, BLOOD: Lipase: 22 U/L (ref 11–51)

## 2023-12-04 MED ORDER — IOHEXOL 300 MG/ML  SOLN
100.0000 mL | Freq: Once | INTRAMUSCULAR | Status: AC | PRN
  Administered 2023-12-04: 100 mL via INTRAVENOUS

## 2023-12-04 MED ORDER — PANTOPRAZOLE SODIUM 20 MG PO TBEC: 20.0000 mg | DELAYED_RELEASE_TABLET | Freq: Every day | ORAL | 0 refills | Status: DC

## 2023-12-04 MED ORDER — PANTOPRAZOLE SODIUM 40 MG IV SOLR
40.0000 mg | Freq: Once | INTRAVENOUS | Status: AC
  Administered 2023-12-04: 40 mg via INTRAVENOUS
  Filled 2023-12-04: qty 10

## 2023-12-04 MED ORDER — DICYCLOMINE HCL 20 MG PO TABS: 20.0000 mg | ORAL_TABLET | Freq: Two times a day (BID) | ORAL | 0 refills | Status: AC

## 2023-12-04 MED ORDER — DICYCLOMINE HCL 10 MG/ML IM SOLN
20.0000 mg | Freq: Once | INTRAMUSCULAR | Status: AC
  Administered 2023-12-04: 20 mg via INTRAMUSCULAR
  Filled 2023-12-04: qty 2

## 2023-12-04 NOTE — ED Triage Notes (Signed)
C/o central abd pain w/ n/v/d x 7 days.

## 2023-12-04 NOTE — ED Provider Notes (Signed)
[place] EMERGENCY DEPARTMENT AT [facility] Provider Note   CSN: [id number] Arrival date & time: 12/04/23  1528     History  Chief Complaint  Patient presents with   Abdominal Pain    Lori Acevedo is a 58 y.o. female with a history of hypertension who presents to the ED today for abdominal pain.  Patient reports that she was out of town 2 weeks ago and was drinking fair amount of alcohol.  She states that she came back in town last week and went to a buffet with her brother.  That day, she had multiple episodes of vomiting that only lasted for the day.  She has been having intermittent epigastric pain and diarrhea diarrhea since then as well.  She has tried taking Tums without relief and Mylanta with some relief.  She denies any fevers, chest pain, or shortness of breath.  Believes her symptoms might be due to acid reflux.  Denies any associated back pain, dark/tarry stools, bright red blood in her stools, or dysuria. No additional complaints or concerns at this time.    Home Medications Prior to Admission medications   Medication Sig Start Date End Date Taking? Authorizing Provider  dicyclomine (BENTYL) 20 MG tablet Take 1 tablet (20 mg total) by mouth 2 (two) times daily for 14 days. 12/04/23 12/18/23 Yes Maxwell Marion, PA-C  pantoprazole (PROTONIX) 20 MG tablet Take 1 tablet (20 mg total) by mouth daily for 14 days. 12/04/23 12/18/23 Yes Maxwell Marion, PA-C  cetirizine (ZYRTEC ALLERGY) 10 MG tablet Take 1 tablet (10 mg total) by mouth 2 (two) times daily. 05/18/22   Rosezella Rumpf, PA-C  famotidine (PEPCID) 20 MG tablet Take 1 tablet (20 mg total) by mouth 2 (two) times daily. 05/18/22   Rosezella Rumpf, PA-C  Lidocaine (HM LIDOCAINE PATCH) 4 % PTCH Apply 1 patch topically every 12 (twelve) hours as needed. 09/24/21   Haskel Schroeder, PA-C  losartan (COZAAR) 25 MG tablet Take 1 tablet (25 mg total) by mouth daily. 05/19/22   Lamptey, Britta Mccreedy, MD  montelukast  (SINGULAIR) 10 MG tablet Take 10 mg by mouth daily. 03/29/21   [provider]  olopatadine (PATANOL) 0.1 % ophthalmic solution Place 1 drop into both eyes 2 (two) times daily. 05/19/22   LampteyBritta Mccreedy, MD  triamcinolone (NASACORT) 55 MCG/ACT AERO nasal inhaler Place 2 sprays into the nose daily. 2 sprays each nostril at night 08/02/21   Drema Halon, MD      Allergies    Patient has no known allergies.    Review of Systems   Review of Systems  Gastrointestinal:  Positive for abdominal pain.  All other systems reviewed and are negative.   Physical Exam Updated Vital Signs BP (!) 153/84   Pulse 75   Temp 98.4 F (36.9 C)   Resp 16   Ht 5\' 10"  (1.778 m)   Wt 98.9 kg   SpO2 100%   BMI 31.28 kg/m  Physical Exam Vitals and nursing note reviewed.  Constitutional:      General: She is not in acute distress.    Appearance: Normal appearance.  HENT:     Head: Normocephalic and atraumatic.     Mouth/Throat:     Mouth: Mucous membranes are moist.  Eyes:     Conjunctiva/sclera: Conjunctivae normal.     Pupils: Pupils are equal, round, and reactive to light.  Cardiovascular:     Rate and Rhythm: Normal rate and regular  rhythm.     Pulses: Normal pulses.     Heart sounds: Normal heart sounds.  Pulmonary:     Effort: Pulmonary effort is normal.     Breath sounds: Normal breath sounds.  Abdominal:     Palpations: Abdomen is soft.     Tenderness: There is no abdominal tenderness. There is no right CVA tenderness or left CVA tenderness.  Musculoskeletal:        General: Normal range of motion.     Cervical back: Normal range of motion.  Skin:    General: Skin is warm and dry.     Findings: No rash.  Neurological:     General: No focal deficit present.     Mental Status: She is alert.  Psychiatric:        Mood and Affect: Mood normal.        Behavior: Behavior normal.    ED Results / Procedures / Treatments   Labs (all labs ordered are listed, but only  abnormal results are displayed) Labs Reviewed  COMPREHENSIVE METABOLIC PANEL - Abnormal; Notable for the following components:      Result Value   Glucose, Bld 103 (*)    All other components within normal limits  CBC WITH DIFFERENTIAL/PLATELET  LIPASE, BLOOD  URINALYSIS, ROUTINE W REFLEX MICROSCOPIC    EKG None  Radiology CT ABDOMEN PELVIS W CONTRAST Result Date: 12/04/2023 CLINICAL DATA:  Acute abdominal pain. EXAM: CT ABDOMEN AND PELVIS WITH CONTRAST TECHNIQUE: Multidetector CT imaging of the abdomen and pelvis was performed using the standard protocol following bolus administration of intravenous contrast. RADIATION DOSE REDUCTION: This exam was performed according to the departmental dose-optimization program which includes automated exposure control, adjustment of the mA and/or kV according to patient size and/or use of iterative reconstruction technique. CONTRAST:  OMNIPAQUE IOHEXOL 300 MG/ML  SOLN COMPARISON:  None Available. FINDINGS: Lower chest: The lung bases are clear. Hepatobiliary: Mild diffuse hepatic steatosis. Multiple subcentimeter small hepatic hypodensities in both lobes of the liver, too small to characterize. These are likely cysts or hamartomas, and need no specific imaging follow-up. The gallbladder is only partially distended and contains multiple folds. No calcified gallstones or pericholecystic inflammation. No biliary dilatation. Pancreas: No ductal dilatation or inflammation. Spleen: Normal in size without focal abnormality. Adrenals/Urinary Tract: No adrenal nodule. No hydronephrosis or perinephric edema. Homogeneous renal enhancement with symmetric excretion on delayed phase imaging. No renal calculi or suspicious renal abnormality. Urinary bladder is partially distended without wall thickening. Stomach/Bowel: No bowel obstruction or inflammation. Decompressed stomach. Normal appendix. Small to moderate colonic stool burden. Vascular/Lymphatic: Minimal aortic  atherosclerosis. No aortic aneurysm. The portal vein is patent. No enlarged lymph nodes in the abdomen or pelvis. Reproductive: Uterus and bilateral adnexa are unremarkable. Other: No free air, free fluid, or intra-abdominal fluid collection. Musculoskeletal: L4-L5 facet hypertrophy results in grade 1 anterolisthesis of L4 on L5. There are no acute or suspicious osseous abnormalities. IMPRESSION: 1. No acute abnormality or explanation for abdominal pain. 2. Mild hepatic steatosis. Aortic Atherosclerosis (ICD10-I70.0). Electronically Signed   By: Narda Rutherford M.D.   On: 12/04/2023 20:02    Procedures Procedures: not indicated.   Medications Ordered in ED Medications  pantoprazole (PROTONIX) injection 40 mg (40 mg Intravenous Given 12/04/23 1850)  dicyclomine (BENTYL) injection 20 mg (20 mg Intramuscular Given 12/04/23 1856)  iohexol (OMNIPAQUE) 300 MG/ML solution 100 mL (100 mLs Intravenous Contrast Given 12/04/23 1934)    ED Course/ Medical Decision Making/ A&P  Medical Decision Making Amount and/or Complexity of Data Reviewed Radiology: ordered.  Risk Prescription drug management.   This patient presents to the ED for concern of abdominal pain, this involves an extensive number of treatment options, and is a complaint that carries with it a high risk of complications and morbidity.   Differential diagnosis includes: Gastritis, gastroenteritis, PUD, pancreatitis, cholecystitis, cholangitis, choledocholithiasis, IBS, IBD, etc.   Comorbidities  See HPI above   Additional History  Additional history obtained from prior records.   Lab Tests  I ordered and personally interpreted labs.  The pertinent results include:   CMP, lipase, and CBC unremarkable - no acute electrolyte derangement, AKI, infection, or anemia UA is unremarkable   Imaging Studies  I ordered imaging studies including CT abdomen/pelvis I independently visualized and  interpreted imaging which showed: No acute intra-abdominal process.  Mild hepatic steatosis. I agree with the radiologist interpretation   Problem List / ED Course / Critical Interventions / Medication Management  Intermittent epigastric pain x1 week I ordered medications including: Protonix and Bentyl for abdominal pain  Reevaluation of the patient after these medicines showed that the patient improved I have reviewed the patients home medicines and have made adjustments as needed Discussed finding with patient. All questions were answered. Prescriptions for Pantoprazole and Bentyl sent to the pharmacy.   Social Determinants of Health  Access to healthcare   Test / Admission - Considered  Patient is hemodynamically stable for discharge home. Return precautions provided.       Final Clinical Impression(s) / ED Diagnoses Final diagnoses:  Epigastric pain  Hepatic steatosis    Rx / DC Orders ED Discharge Orders          Ordered    dicyclomine (BENTYL) 20 MG tablet  2 times daily        12/04/23 2008    pantoprazole (PROTONIX) 20 MG tablet  Daily        12/04/23 2008              Maxwell Marion, PA-C 12/04/23 2020    Anders Simmonds T, DO 12/05/23 2315

## 2023-12-04 NOTE — Discharge Instructions (Signed)
As discussed, your labs and imaging are reassuring.  Your CT showed incidental finding of fatty liver.  This is a reversible diagnosis that can be due to cholesterol levels and diet. Take Pantoprazole once a day in the morning for the next 14 days for abdominal pain. Take Bentyl up to 3 times a day as needed for abdominal pain/cramping.  Follow-up with your PCP in the next 5 to 7 days for reevaluation of your symptoms.  Get help right away if: You cannot stop vomiting. Your pain is only in one part of your belly, like on the right side. You have bloody or black poop, or poop that looks like tar. You have trouble breathing. You have chest pain.

## 2023-12-04 NOTE — ED Provider Triage Note (Signed)
Emergency Medicine Provider Triage Evaluation Note  Lori Acevedo , a 58 y.o. female  was evaluated in triage.  Pt complains of abd pain.  Review of Systems  Positive: Pain, diarrhea Negative: fever  Physical Exam  BP (!) 149/105 (BP Location: Right Arm)   Pulse 81   Temp 98.4 F (36.9 C)   Resp 14   Ht 5\' 10"  (1.778 m)   Wt 98.9 kg   SpO2 98%   BMI 31.28 kg/m  Gen:   Awake, no distress   Resp:  Normal effort  MSK:   Moves extremities without difficulty  Other:    Medical Decision Making  Medically screening exam initiated at 3:37 PM.  Appropriate orders placed.  GERALDA BAUMGARDNER was informed that the remainder of the evaluation will be completed by another provider, this initial triage assessment does not replace that evaluation, and the importance of remaining in the ED until their evaluation is complete.  One week of intermittent abdominal pain and loose stools. NO fever.    Elpidio Anis, PA-C 12/04/23 1538

## 2023-12-04 NOTE — ED Notes (Signed)
AVS provided by edp was reviewed with pt. Pt verbalized understanding of discharge instructions. Pharmacy verified with pt. Pt denies any additional questions at this time.

## 2024-01-08 ENCOUNTER — Ambulatory Visit (INDEPENDENT_AMBULATORY_CARE_PROVIDER_SITE_OTHER)

## 2024-01-08 ENCOUNTER — Ambulatory Visit (INDEPENDENT_AMBULATORY_CARE_PROVIDER_SITE_OTHER): Payer: Medicaid Other | Admitting: Podiatry

## 2024-01-08 ENCOUNTER — Encounter: Payer: Self-pay | Admitting: Podiatry

## 2024-01-08 DIAGNOSIS — M7751 Other enthesopathy of right foot: Secondary | ICD-10-CM | POA: Diagnosis not present

## 2024-01-08 DIAGNOSIS — M79671 Pain in right foot: Secondary | ICD-10-CM

## 2024-01-08 MED ORDER — METHYLPREDNISOLONE 4 MG PO TBPK: ORAL_TABLET | ORAL | 0 refills | Status: DC

## 2024-01-08 NOTE — Progress Notes (Signed)
  Subjective:  Patient ID: Lori Acevedo, female    DOB: 02/16/1966,   MRN: [id number]  Chief Complaint  Patient presents with   Foot Pain                                                                                     "  I had surgery in 2019 on my right foot and  I have had increased pain since then like a sharp pain"       58 y.o. female presents for concern of right foot pain. Relates in 2019 she had surgery on the foot. 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 Relates after surgery she did ok and did have hardware removed. Relates recently though she has been getting more pain in the ball of her foot area around the surgical site and has been worsening. Denies burning tingling or numbness3 . Denies any other pedal complaints. Denies n/v/f/c.   Past Medical History:  Diagnosis Date   Hypertension     Objective:  Physical Exam: Vascular: DP/PT pulses 2/4 bilateral. CFT <3 seconds. Normal hair growth on digits. No edema.  Skin. No lacerations or abrasions bilateral feet.  Musculoskeletal: MMT 5/5 bilateral lower extremities in DF, PF, Inversion and Eversion. Deceased ROM in DF of ankle joint. No ROM noted at hallux first MPJ. No major pain to plaption about the joint and fusion site but is slightly tender to plantar sesamoid area. Does appear to be fused in decent position with no major plantar flexion or dorsiflexion noted.  Neurological: Sensation intact to light touch.   Assessment:   1. Capsulitis of metatarsophalangeal (MTP) joint of right foot      Plan:  Patient was evaluated and treated and all questions answered. -Xrays reviewed. No acute fractures or dislocations. Fusion of hallux MPJ. Appears to be solid union. Hardware removed.  -Discussed hallux limitus and  treatement options; conservative and  Surgical management; risks, benefits, alternatives discussed. All patient's  questions answered. -Rx Medrol dose pack provided.  -Discussed CMO and will look into.  -Recommend continue with good supportive shoes and inserts. Discussed stiff soled shoes and use of carbon fiber foot plate.   -Patient to return to office in 5 weeks for recheck. May consider MRI for further evaluation vs injection.    Louann Sjogren, DPM

## 2024-02-12 ENCOUNTER — Ambulatory Visit: Admitting: Podiatry

## 2024-04-05 ENCOUNTER — Other Ambulatory Visit: Payer: Self-pay

## 2024-04-05 ENCOUNTER — Encounter (HOSPITAL_BASED_OUTPATIENT_CLINIC_OR_DEPARTMENT_OTHER): Payer: Self-pay

## 2024-04-05 ENCOUNTER — Emergency Department (HOSPITAL_BASED_OUTPATIENT_CLINIC_OR_DEPARTMENT_OTHER)

## 2024-04-05 ENCOUNTER — Emergency Department (HOSPITAL_BASED_OUTPATIENT_CLINIC_OR_DEPARTMENT_OTHER)
Admission: EM | Admit: 2024-04-05 | Discharge: 2024-04-05 | Disposition: A | Discharge status: Home / Self Care | Attending: Emergency Medicine | Admitting: Emergency Medicine

## 2024-04-05 ENCOUNTER — Emergency Department (HOSPITAL_BASED_OUTPATIENT_CLINIC_OR_DEPARTMENT_OTHER): Admitting: Radiology

## 2024-04-05 DIAGNOSIS — Z79899 Other long term (current) drug therapy: Secondary | ICD-10-CM | POA: Diagnosis not present

## 2024-04-05 DIAGNOSIS — M5442 Lumbago with sciatica, left side: Secondary | ICD-10-CM | POA: Insufficient documentation

## 2024-04-05 DIAGNOSIS — Z853 Personal history of malignant neoplasm of breast: Secondary | ICD-10-CM | POA: Insufficient documentation

## 2024-04-05 DIAGNOSIS — I10 Essential (primary) hypertension: Secondary | ICD-10-CM | POA: Diagnosis not present

## 2024-04-05 DIAGNOSIS — R202 Paresthesia of skin: Secondary | ICD-10-CM | POA: Diagnosis not present

## 2024-04-05 DIAGNOSIS — M79642 Pain in left hand: Secondary | ICD-10-CM | POA: Diagnosis not present

## 2024-04-05 DIAGNOSIS — M545 Low back pain, unspecified: Secondary | ICD-10-CM | POA: Diagnosis present

## 2024-04-05 MED ORDER — TIZANIDINE HCL 4 MG PO TABS
4.0000 mg | ORAL_TABLET | Freq: Four times a day (QID) | ORAL | 0 refills | Status: AC | PRN
Start: 2024-04-05 — End: ?

## 2024-04-05 MED ORDER — LIDOCAINE 5 % EX PTCH
1.0000 | MEDICATED_PATCH | CUTANEOUS | Status: DC
  Administered 2024-04-05: 1 via TRANSDERMAL
  Filled 2024-04-05: qty 1

## 2024-04-05 MED ORDER — LIDOCAINE 5 % EX PTCH: 1.0000 | MEDICATED_PATCH | CUTANEOUS | 0 refills | Status: AC

## 2024-04-05 MED ORDER — KETOROLAC TROMETHAMINE 30 MG/ML IJ SOLN
30.0000 mg | Freq: Once | INTRAMUSCULAR | Status: AC
  Administered 2024-04-05: 30 mg via INTRAMUSCULAR
  Filled 2024-04-05: qty 1

## 2024-04-05 MED ORDER — METHYLPREDNISOLONE 4 MG PO TBPK: ORAL_TABLET | ORAL | 0 refills | Status: AC

## 2024-04-05 NOTE — Discharge Instructions (Addendum)
 As discussed, we will try a few different new medications for treatment of your symptoms.  Recommend follow-up with the spinal specialist in the outpatient setting for reassessment of your low back pain.  Will also need physical therapy referral to have them evaluate you and treat you for your low back pain.  Recommend follow-up with neurology for the facial tingling sensation as well.  Please not hesitate to return if the worrisome signs and symptoms we discussed become apparent.

## 2024-04-05 NOTE — ED Triage Notes (Signed)
 Arrives POV with complaints of worsening back pain radiating dow her left leg "for years". Pain has increased over the past 2 weeks, with facial tingling and finger numbness x2 weeks as well.

## 2024-04-05 NOTE — ED Provider Notes (Signed)
 [facility] AT [facility] Provider Note   CSN: [id number] Arrival date & time: 04/05/24  1224     History  Chief Complaint  Patient presents with   Back Pain    Lori Acevedo is a 58 y.o. female.   Back Pain   58 year old female presents emergency department with complaints of left low back pain with radiation down left knee.  States that she has been having these symptoms for "years" states that she has been doing the pain for chronically and was every day for the past year.  Has been on multiple medications for the symptoms.  Denies any saddle anesthesia, bowel/bladder dysfunction, weakness or sensory deficit lower extremities, fever, history of IV drug use.  Has been on multiple rounds of corticosteroids for her pain.  Does report remote history of breast cancer 1991.  Also complaining of pain in some of the joints of her left hand.  States that these have been swollen and painful with certain movements.  Denies any weakness or sensory deficits in the left upper extremity.  Also endorsing some left-sided facial tingling.  States that she has been having FOR "months" but the tingling has been more consistent over the past week or so.  Denies any visual disturbance, gait abnormality, slurred speech, facial droop.  Past medical history is significant for hypertension, chronic back pain, breast cancer  Home Medications Prior to Admission medications   Medication Sig Start Date End Date Taking? Authorizing Provider  Azelastine HCl 137 MCG/SPRAY SOLN Place 1 spray into both nostrils. 12/08/23   [provider]  cetirizine  (ZYRTEC  ALLERGY) 10 MG tablet Take 1 tablet (10 mg total) by mouth 2 (two) times daily. 05/18/22   Kehrli, Kelsey F, PA-C  dicyclomine  (BENTYL ) 20 MG tablet Take 1 tablet (20 mg total) by mouth 2 (two) times daily for 14 days. 12/04/23 12/18/23  Sonnie Dusky, PA-C  famotidine  (PEPCID ) 20 MG tablet Take 1 tablet (20 mg total) by  mouth 2 (two) times daily. 05/18/22   Kehrli, Kelsey F, PA-C  Lidocaine  (HM LIDOCAINE  PATCH) 4 % PTCH Apply 1 patch topically every 12 (twelve) hours as needed. 09/24/21   Marshal Skeens, PA-C  losartan  (COZAAR ) 25 MG tablet Take 1 tablet (25 mg total) by mouth daily. 05/19/22   Corine Dice, MD  methylPREDNISolone  (MEDROL  DOSEPAK) 4 MG TBPK tablet Take as directed 01/08/24   Sikora, Rebecca, DPM  montelukast  (SINGULAIR ) 10 MG tablet Take 10 mg by mouth daily. 03/29/21   [provider]  olopatadine  (PATANOL) 0.1 % ophthalmic solution Place 1 drop into both eyes 2 (two) times daily. 05/19/22   Corine Dice, MD  triamcinolone  (NASACORT ) 55 MCG/ACT AERO nasal inhaler Place 2 sprays into the nose daily. 2 sprays each nostril at night 08/02/21   Prescott Brodie, MD      Allergies    Pollen extract    Review of Systems   Review of Systems  Musculoskeletal:  Positive for back pain.  All other systems reviewed and are negative.   Physical Exam Updated Vital Signs BP (!) 147/99 (BP Location: Right Arm)   Pulse 91   Temp 98.6 F (37 C) (Oral)   Resp 18   Ht 5\' 10"  (1.778 m)   Wt 94.3 kg   SpO2 100%   BMI 29.84 kg/m  Physical Exam Vitals and nursing note reviewed.  Constitutional:      General: She is not in acute distress.  Appearance: She is well-developed.  HENT:     Head: Normocephalic and atraumatic.  Eyes:     Conjunctiva/sclera: Conjunctivae normal.  Cardiovascular:     Rate and Rhythm: Normal rate and regular rhythm.     Heart sounds: No murmur heard. Pulmonary:     Effort: Pulmonary effort is normal. No respiratory distress.     Breath sounds: Normal breath sounds.  Abdominal:     Palpations: Abdomen is soft.     Tenderness: There is no abdominal tenderness.  Musculoskeletal:        General: No swelling.     Cervical back: Neck supple.     Comments: No midline tenderness cervical, thoracic spine, lumbar spine.  Paraspinal The Left Lumbar  Region.  Straight Leg Raise Positive on the Left.  Muscular Strength 5 out of 5 Bilateral Upper Extremities.  No Sensory Deficits on Major Nerve Distributions of Lower Extremities.  DTR Symmetric at Patella As Well As Achilles.  Pedal, Posterior Tibial and Radial Pulses 2+ Bilaterally.  Patient Also with Swelling to PIP DIP Joints 3rd and 4th Digits of Left Hand.  No Overlying Erythema, +/Induration.  Skin:    General: Skin is warm and dry.     Capillary Refill: Capillary refill takes less than 2 seconds.  Neurological:     Mental Status: She is alert.     Comments: Alert and oriented to self, place, time and event.   Speech is fluent, clear without dysarthria or dysphasia.   Strength 5/5 in upper/lower extremities   Sensation intact in upper/lower extremities   Normal gait.  CN I not tested  CN II not tested CN III, IV, VI PERRLA and EOMs intact bilaterally  CN V Intact sensation to sharp and light touch to the face  CN VII facial movements symmetric  CN VIII not tested  CN IX, X no uvula deviation, symmetric rise of soft palate  CN XI 5/5 SCM and trapezius strength bilaterally  CN XII Midline tongue protrusion, symmetric L/R movements     Psychiatric:        Mood and Affect: Mood normal.    ED Results / Procedures / Treatments   Labs (all labs ordered are listed, but only abnormal results are displayed) Labs Reviewed - No data to display  EKG None  Radiology No results found.  Procedures Procedures    Medications Ordered in ED Medications - No data to display  ED Course/ Medical Decision Making/ A&P                                 Medical Decision Making Amount and/or Complexity of Data Reviewed Radiology: ordered.  Risk Prescription drug management.   This patient presents to the ED for concern of back pain, left hand pain, this involves an extensive number of treatment options, and is a complaint that carries with it a high risk of complications and  morbidity.  The differential diagnosis includes fracture, strain/sprain, dislocation, pyelonephritis, nephrolithiasis, aortic dissection, cauda equina, spinal epidural abscess, others medical compression/impingement, sciatica, rheumatologic condition, osteoarthritis, other   Co morbidities that complicate the patient evaluation  See HPI   Additional history obtained:  Additional history obtained from EMR External records from outside source obtained and reviewed including hospital records   Lab Tests:  N/a   Imaging Studies ordered:  I ordered imaging studies including CT head, left hand x-ray, lumbar x-ray I independently visualized and interpreted  imaging which showed  CT head: No acute intracranial abnormality. Left hand x-ray: No acute abnormality.  Degenerative changes. Lumbar x-ray: Mild degenerative disc disease moderate facet hypertrophy L5 L4 with grade 1 anterolisthesis. I agree with the radiologist interpretation  Cardiac Monitoring: / EKG:  The patient was maintained on a cardiac monitor.  I personally viewed and interpreted the cardiac monitored which showed an underlying rhythm of: sinus rhythm   Consultations Obtained:  N/a   Problem List / ED Course / Critical interventions / Medication management  Left lower back pain with sciatica, left hand pain I ordered medication including Toradol , Lidoderm    Reevaluation of the patient after these medicines showed that the patient improved I have reviewed the patients home medicines and have made adjustments as needed   Social Determinants of Health:  Denies tobacco, licit drug use.   Test / Admission - Considered:  Left lower back pain with sciatica, left hand pain Vitals signs significant for hypertension blood pressure 147/89. Otherwise within normal range and stable throughout visit. Imaging studies significant for: See above 58 year old female presents emergency department with complaints of left low  back pain with radiation down left knee.  States that she has been having these symptoms for "years" states that she has been doing the pain for chronically and was every day for the past year.  Has been on multiple medications for the symptoms.  Denies any saddle anesthesia, bowel/bladder dysfunction, weakness or sensory deficit lower extremities, fever, history of IV drug use.  Has been on multiple rounds of corticosteroids for her pain.  Does report remote history of breast cancer 1991.  Also complaining of pain in some of the joints of her left hand.  States that these have been swollen and painful with certain movements.  Denies any weakness or sensory deficits in the left upper extremity.  Also endorsing some left-sided facial tingling.  States that she has been having FOR "months" but the tingling has been more consistent over the past week or so.  Denies any visual disturbance, gait abnormality, slurred speech, facial droop. Regarding back pain, on exam, paraspinal tenderness noted in the left lumbar region.  Straight leg raise positive on the left.  No red flag signs for back pain on HPI/PE besides remote history of breast cancer.  Screening x-ray was performed which did not show evidence of metastatic disease but with degenerative changes as above.  Low suspicion for cauda equina, spinal epidural abscess, other spinal cord compression/impingement.  Patient without obvious CVA tenderness, urinary symptoms; low suspicion for pyelonephritis, nephrolithiasis.  Suspect patient's symptoms likely secondary to left-sided sciatica.  Patient has not seen a spinal specialist in the outpatient setting so we will provide outpatient resources for follow-up as well as PT referral after long discussion was made regarding potential benefits of being assessed and treated by physical therapy.  Will trial Medrol  dose taper as well as Zanaflex  as needed.  Regarding joint pain fingers of left hand, leg swelling proximal as  well as distal IP joints 3rd and 4th digits of left hand.  No overlying skin changes concerning for second infectious process.  X-ray performed digits are with degenerative changes.  Suspect more arthritic pathology.  Patient send should benefit from steroid taper being placed on for sciatica type pain.  Treatment plan discussed with patient and she acknowledged understanding was agreeable to the plan.  Patient will well-appearing, afebrile in no acute distress. Worrisome signs and symptoms were discussed with the patient, and the patient  acknowledged understanding to return to the ED if noticed. Patient was stable upon discharge.          Final Clinical Impression(s) / ED Diagnoses Final diagnoses:  None    Rx / DC Orders ED Discharge Orders     None         Blockton Butter, [place] 04/05/24 1830    Lind Repine, MD 04/08/24 1521

## 2024-04-22 ENCOUNTER — Ambulatory Visit: Attending: Internal Medicine

## 2024-04-22 DIAGNOSIS — M6281 Muscle weakness (generalized): Secondary | ICD-10-CM | POA: Insufficient documentation

## 2024-04-22 DIAGNOSIS — M5459 Other low back pain: Secondary | ICD-10-CM | POA: Diagnosis present

## 2024-04-22 NOTE — Therapy (Signed)
 OUTPATIENT PHYSICAL THERAPY THORACOLUMBAR EVALUATION   Patient Name: Lori Acevedo MRN: [id number] DOB:08-14-1966, 58 y.o., female Today's Date: 04/22/2024  END OF SESSION:  PT End of Session - 04/22/24 0801     Visit Number 1    Number of Visits 13    Date for PT Re-Evaluation 06/03/24    Authorization Type [employer] Medicaid    PT Start Time 0800    PT Stop Time 0845    PT Time Calculation (min) 45 min    Activity Tolerance Patient tolerated treatment well    Behavior During Therapy WFL for tasks assessed/performed          Past Medical History:  Diagnosis Date   Hypertension    Past Surgical History:  Procedure Laterality Date   BREAST LUMPECTOMY Right 1991   cancer   CESAREAN SECTION  1990   UTERINE FIBROID SURGERY  2004   Patient Active Problem List   Diagnosis Date Noted   Rectal pain 08/20/2020    PCP: Dr. Alberteen Aloe  REFERRING PROVIDER: Nohemi Batters, MD  REFERRING DIAG: [phone] (ICD-10-CM) - Lumbago with sciatica, left side G89.29 (ICD-10-CM) - Other chronic pain  Rationale for Evaluation and Treatment: Rehabilitation  THERAPY DIAG:  Other low back pain  Muscle weakness (generalized)  ONSET DATE: 04/17/2024- date of referral  SUBJECTIVE:                                                                                                                                                                                           SUBJECTIVE STATEMENT: Pt reports she has intermittent back pain for years but now it is constant for 3 years. Pt reports pain has been traveling to her Left LE since last year. Pt reports pain is all over in her left leg. Pt also reports of numbness and tingling in L leg. Left leg pain is noticeable with prolonged standing, walking, sitting. Lying relieves the pain and sitting temporary relieves the pain. 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  PERTINENT HISTORY:  Past medical history is significant for  hypertension, chronic back pain, breast cancer  PAIN:  Are you having pain? Yes: NPRS scale: 10/10 pain Pain location: LBP, left gluts, left leg Pain description: sharp, radiating, numbness, tingling Aggravating factors: Prolonged standing, walking, sitting Relieving factors: lying  PRECAUTIONS: None  RED FLAGS: None   WEIGHT BEARING RESTRICTIONS: No  FALLS:  Has patient fallen in last 6 months? No   OCCUPATION: Works for [employer]  PLOF: Independent  PATIENT GOALS: Improve pain  NEXT MD VISIT:   OBJECTIVE:  Note: Objective measures were completed  at Evaluation unless otherwise noted.  DIAGNOSTIC FINDINGS:  04/05/24: MRI L-spine IMPRESSION: Mild degenerative disc disease and moderate facet hypertrophy at L4-L5 with grade 1 anterolisthesis.  PATIENT SURVEYS:  Modified Oswestry 37/50   COGNITION: Overall cognitive status: Within functional limits for tasks assessed     PALPATION: Hypersensitivity to touch in left lumbar spine around L4-5, L glut, bil greater torcahnters.  LUMBAR ROM:   AROM eval  Flexion 100% with pain when standing back up  Extension 100%  Right lateral flexion 100%  Left lateral flexion 100%  Right rotation   Left rotation    (Blank rows = not tested)  LOWER EXTREMITY ROM:     Active  Right eval Left eval  Hip flexion    Hip extension    Hip abduction    Hip adduction    Hip internal rotation    Hip external rotation    Knee flexion    Knee extension    Ankle dorsiflexion    Ankle plantarflexion    Ankle inversion    Ankle eversion     (Blank rows = not tested)  LOWER EXTREMITY MMT:    MMT Right eval Left eval  Hip flexion    Hip extension    Hip abduction    Hip adduction    Hip internal rotation    Hip external rotation    Knee flexion    Knee extension    Ankle dorsiflexion    Ankle plantarflexion    Ankle inversion    Ankle eversion     (Blank rows = not tested)     TREATMENT DATE: 04/22/24                                                                                                                                Manual therapy: Myofascial release to L quadratus lumborum, multifidus, lumbar paraspinalis and cleared posterior iliac crest border TherEx: R SL, L QL stretch with dropping leg off EOB: 3 x 30 L SL hip abduction: from negative position with leg off EOB: 2 x 10 L only Supine knee to chest: 20x 5 holds Supine piriformis stretch: 3 x 30    PATIENT EDUCATION:  Education details: see above Person educated: Patient Education method: Medical illustrator Education comprehension: verbalized understanding  HOME EXERCISE PROGRAM: Access Code: [email or web address] URL: [email or web address] Date: 04/22/2024 Prepared by: Susanna Epley  Exercises - Hooklying Single Knee to Chest  - 2-3 x daily - 7 x weekly - 20 reps - 5 hold - Supine Piriformis Stretch with Foot on Ground  - 2-3 x daily - 7 x weekly - 3 reps - 30 hold  ASSESSMENT:  CLINICAL IMPRESSION: Patient is a 58 y.o. female who was seen today for physical therapy evaluation and treatment for chronic LBP with lumbar radiculopathy in L LE. Objective impairments include increased muscle spasms, mild neural tension in L LE, . These impairments are limiting patient from performing  prolonged standing, walking, sitting, bending, and lifting which impacts her ADLs, MRADLs, and work activities. Patient will benefit from skilled PT to address these impairments and improve overall function. .   OBJECTIVE IMPAIRMENTS: decreased activity tolerance, decreased endurance, decreased mobility, decreased ROM, decreased strength, hypomobility, increased fascial restrictions, increased muscle spasms, impaired flexibility, postural dysfunction, and pain.   ACTIVITY LIMITATIONS: carrying, lifting, bending, sitting, standing, squatting, and stairs  PARTICIPATION LIMITATIONS: meal prep, cleaning, laundry, driving, shopping,  community activity, and occupation  PERSONAL FACTORS: Time since onset of injury/illness/exacerbation are also affecting patient's functional outcome.   REHAB POTENTIAL: Good  CLINICAL DECISION MAKING: Stable/uncomplicated  EVALUATION COMPLEXITY: Low   GOALS: Goals reviewed with patient? Yes  SHORT TERM GOALS: Target date: 05/20/2024    Patient will demo at least 50% compliance with HEP to self manage her symptoms.  Baseline: Initiated on 04/22/24 Goal status: INITIAL   LONG TERM GOALS: Target date: 06/17/2024    Patient will report 50% improvement in her L lumbar radiculopathy symptoms to improve overall function. Baseline:  Goal status: INITIAL  2.  Patient will demo at least 10-15% (or 10 points) improvement in her modified oswestery score to improve all function  Baseline: 04/22/24: 37/50 = 74% disability Goal status: INITIAL  3.  Patient will be 100% Independent and compliant with managing her LBP symptoms to improve independent self management.  Baseline:  Goal status: INITIAL  4.  Patient will reports of LBP of <5/10 at worst to improve her overall function. Baseline: 10/10 Goal status: INITIAL   PLAN:  PT FREQUENCY: 2x/week  PT DURATION: 8 weeks  PLANNED INTERVENTIONS: [id number]- PT Re-evaluation, 97750- Physical Performance Testing, 97110-Therapeutic exercises, 97530- Therapeutic activity, 97112- Neuromuscular re-education, 97535- Self Care, [id number]- Manual therapy, [phone]- Gait training, [phone]- Aquatic Therapy, Patient/Family education, Balance training, Stair training, Joint mobilization, Joint manipulation, Spinal manipulation, Spinal mobilization, DME instructions, Cryotherapy, and Moist heat.  PLAN FOR NEXT SESSION: Review and progress HEP as tolerated   Kristine Phalen, PT 04/22/2024, 8:18 AM

## 2024-04-26 ENCOUNTER — Ambulatory Visit

## 2024-04-26 DIAGNOSIS — M5459 Other low back pain: Secondary | ICD-10-CM

## 2024-04-26 DIAGNOSIS — M6281 Muscle weakness (generalized): Secondary | ICD-10-CM

## 2024-04-26 NOTE — Therapy (Signed)
 OUTPATIENT PHYSICAL THERAPY THORACOLUMBAR TREATMENT NOTE   Patient Name: Lori Acevedo MRN: [id number] DOB:1965-11-28, 58 y.o., female Today's Date: 04/26/2024  END OF SESSION:  PT End of Session - 04/26/24 0952     Visit Number 2    Number of Visits 13    Date for PT Re-Evaluation 06/03/24    Authorization Type [employer] Medicaid    PT Start Time 0915    PT Stop Time 1000    PT Time Calculation (min) 45 min    Activity Tolerance Patient tolerated treatment well    Behavior During Therapy WFL for tasks assessed/performed           Past Medical History:  Diagnosis Date   Hypertension    Past Surgical History:  Procedure Laterality Date   BREAST LUMPECTOMY Right 1991   cancer   CESAREAN SECTION  1990   UTERINE FIBROID SURGERY  2004   Patient Active Problem List   Diagnosis Date Noted   Rectal pain 08/20/2020    PCP: Dr. Alberteen Aloe  REFERRING PROVIDER: Nohemi Batters, MD  REFERRING DIAG: [phone] (ICD-10-CM) - Lumbago with sciatica, left side G89.29 (ICD-10-CM) - Other chronic pain  Rationale for Evaluation and Treatment: Rehabilitation  THERAPY DIAG:  Other low back pain  Muscle weakness (generalized)  ONSET DATE: 04/17/2024- date of referral  SUBJECTIVE:                                                                                                                                                                                           SUBJECTIVE STATEMENT: Pt reports pain is better overall.   PERTINENT HISTORY:  Past medical history is significant for hypertension, chronic back pain, breast cancer  PAIN:  Are you having pain? Yes: NPRS scale: 7/10 pain Pain location: LBP, left gluts, left leg Pain description: sharp, radiating, numbness, tingling Aggravating factors: Prolonged standing, walking, sitting Relieving factors: lying  PRECAUTIONS: None  RED FLAGS: None   WEIGHT BEARING RESTRICTIONS: No  FALLS:  Has patient fallen in last  6 months? No   OCCUPATION: Works for [employer]  PLOF: Independent  PATIENT GOALS: Improve pain  NEXT MD VISIT:   OBJECTIVE:  Note: Objective measures were completed at Evaluation unless otherwise noted.  DIAGNOSTIC FINDINGS:  04/05/24: MRI L-spine IMPRESSION: Mild degenerative disc disease and moderate facet hypertrophy at L4-L5 with grade 1 anterolisthesis.  PATIENT SURVEYS:  Modified Oswestry 37/50   COGNITION: Overall cognitive status: Within functional limits for tasks assessed     PALPATION: Hypersensitivity to touch in left lumbar spine around L4-5, L glut, bil greater torcahnters.  LUMBAR ROM:   AROM  eval  Flexion 100% with pain when standing back up  Extension 100%  Right lateral flexion 100%  Left lateral flexion 100%  Right rotation   Left rotation    (Blank rows = not tested)  LOWER EXTREMITY ROM:     Active  Right eval Left eval  Hip flexion    Hip extension    Hip abduction    Hip adduction    Hip internal rotation    Hip external rotation    Knee flexion    Knee extension    Ankle dorsiflexion    Ankle plantarflexion    Ankle inversion    Ankle eversion     (Blank rows = not tested)  LOWER EXTREMITY MMT:    MMT Right eval Left eval  Hip flexion    Hip extension    Hip abduction    Hip adduction    Hip internal rotation    Hip external rotation    Knee flexion    Knee extension    Ankle dorsiflexion    Ankle plantarflexion    Ankle inversion    Ankle eversion     (Blank rows = not tested)     TREATMENT DATE: 04/22/24                                                                                                                               Manual therapy: Myofascial release to L>r quadratus lumborum, multifidus, lumbar paraspinalis and cleared posterior iliac crest border TherEx: R SL, L QL stretch with dropping leg off EOB: 3 x 30 L SL hip abduction: from negative position with leg off EOB: 2 x 10 L  only Supine knee to chest: 20x 5 holds Supine piriformis stretch: 3 x 30 SL clamshells: 3 x 10 L only    PATIENT EDUCATION:  Education details: see above Person educated: Patient Education method: Medical illustrator Education comprehension: verbalized understanding  HOME EXERCISE PROGRAM: Access Code: [email or web address] URL: [email or web address] Date: 04/22/2024 Prepared by: Susanna Epley  Exercises - Hooklying Single Knee to Chest  - 2-3 x daily - 7 x weekly - 20 reps - 5 hold - Supine Piriformis Stretch with Foot on Ground  - 2-3 x daily - 7 x weekly - 3 reps - 30 hold  ASSESSMENT:  CLINICAL IMPRESSION: Pt reported improvement in pain since evaluation. Pt reports compliance with HEP. Overall demo decreased sensitivity to manual therapy compared to evaluation.   OBJECTIVE IMPAIRMENTS: decreased activity tolerance, decreased endurance, decreased mobility, decreased ROM, decreased strength, hypomobility, increased fascial restrictions, increased muscle spasms, impaired flexibility, postural dysfunction, and pain.   ACTIVITY LIMITATIONS: carrying, lifting, bending, sitting, standing, squatting, and stairs  PARTICIPATION LIMITATIONS: meal prep, cleaning, laundry, driving, shopping, community activity, and occupation  PERSONAL FACTORS: Time since onset of injury/illness/exacerbation are also affecting patient's functional outcome.   REHAB POTENTIAL: Good  CLINICAL DECISION MAKING: Stable/uncomplicated  EVALUATION COMPLEXITY: Low   GOALS: Goals reviewed with  patient? Yes  SHORT TERM GOALS: Target date: 05/20/2024    Patient will demo at least 50% compliance with HEP to self manage her symptoms.  Baseline: Initiated on 04/22/24 Goal status: INITIAL   LONG TERM GOALS: Target date: 06/17/2024    Patient will report 50% improvement in her L lumbar radiculopathy symptoms to improve overall function. Baseline:  Goal status: INITIAL  2.  Patient will  demo at least 10-15% (or 10 points) improvement in her modified oswestery score to improve all function  Baseline: 04/22/24: 37/50 = 74% disability Goal status: INITIAL  3.  Patient will be 100% Independent and compliant with managing her LBP symptoms to improve independent self management.  Baseline:  Goal status: INITIAL  4.  Patient will reports of LBP of <5/10 at worst to improve her overall function. Baseline: 10/10 Goal status: INITIAL   PLAN:  PT FREQUENCY: 2x/week  PT DURATION: 8 weeks  PLANNED INTERVENTIONS: 97164- PT Re-evaluation, 97750- Physical Performance Testing, 97110-Therapeutic exercises, 97530- Therapeutic activity, 97112- Neuromuscular re-education, 97535- Self Care, 16109- Manual therapy, [phone]- Gait training, [phone]- Aquatic Therapy, Patient/Family education, Balance training, Stair training, Joint mobilization, Joint manipulation, Spinal manipulation, Spinal mobilization, DME instructions, Cryotherapy, and Moist heat.  PLAN FOR NEXT SESSION: Review and progress HEP as tolerated   Kristine Phalen, PT 04/26/2024, 9:53 AM

## 2024-04-29 ENCOUNTER — Ambulatory Visit

## 2024-04-29 DIAGNOSIS — M5459 Other low back pain: Secondary | ICD-10-CM

## 2024-04-29 DIAGNOSIS — M6281 Muscle weakness (generalized): Secondary | ICD-10-CM

## 2024-04-29 NOTE — Therapy (Signed)
 OUTPATIENT PHYSICAL THERAPY THORACOLUMBAR TREATMENT NOTE   Patient Name: Lori Acevedo MRN: [id number] DOB:19-Sep-1966, 58 y.o., female Today's Date: 04/29/2024  END OF SESSION:  PT End of Session - 04/29/24 0920     Visit Number 3    Number of Visits 13    Date for PT Re-Evaluation 06/03/24    Authorization Type [employer] Medicaid    PT Start Time 0850    PT Stop Time 0935    PT Time Calculation (min) 45 min    Activity Tolerance Patient tolerated treatment well    Behavior During Therapy [facility] for tasks assessed/performed            Past Medical History:  Diagnosis Date   Hypertension    Past Surgical History:  Procedure Laterality Date   BREAST LUMPECTOMY Right 1991   cancer   CESAREAN SECTION  1990   UTERINE FIBROID SURGERY  2004   Patient Active Problem List   Diagnosis Date Noted   Rectal pain 08/20/2020    PCP: Dr. Ezekiel [place]  REFERRING PROVIDER: [place] Ezekiel NOVAK, MD  REFERRING DIAG: [phone] (ICD-10-CM) - Lumbago with sciatica, left side G89.29 (ICD-10-CM) - Other chronic pain  Rationale for Evaluation and Treatment: Rehabilitation  THERAPY DIAG:  Other low back pain  Muscle weakness (generalized)  ONSET DATE: 04/17/2024- date of referral  SUBJECTIVE:                                                                                                                                                                                           SUBJECTIVE STATEMENT: Pt reports pain is about 5/10. I still feel little sore from manual therapy last session.   PERTINENT HISTORY:  Past medical history is significant for hypertension, chronic back pain, breast cancer  PAIN:  Are you having pain? Yes: NPRS scale: 5/10 pain Pain location: LBP, left gluts, left leg Pain description: sharp, radiating, numbness, tingling Aggravating factors: Prolonged standing, walking, sitting Relieving factors: lying  PRECAUTIONS: None  RED FLAGS: None   WEIGHT BEARING  RESTRICTIONS: No  FALLS:  Has patient fallen in last 6 months? No   OCCUPATION: Works for [employer]  PLOF: Independent  PATIENT GOALS: Improve pain  NEXT MD VISIT:   OBJECTIVE:  Note: Objective measures were completed at Evaluation unless otherwise noted.  DIAGNOSTIC FINDINGS:  04/05/24: MRI L-spine IMPRESSION: Mild degenerative disc disease and moderate facet hypertrophy at L4-L5 with grade 1 anterolisthesis.  PATIENT SURVEYS:  Modified Oswestry 37/50   COGNITION: Overall cognitive status: Within functional limits for tasks assessed     PALPATION: Hypersensitivity to touch in left lumbar spine around L4-5,  L glut, bil greater torcahnters.  LUMBAR ROM:   AROM eval  Flexion 100% with pain when standing back up  Extension 100%  Right lateral flexion 100%  Left lateral flexion 100%  Right rotation   Left rotation    (Blank rows = not tested)  LOWER EXTREMITY ROM:     Active  Right eval Left eval  Hip flexion    Hip extension    Hip abduction    Hip adduction    Hip internal rotation    Hip external rotation    Knee flexion    Knee extension    Ankle dorsiflexion    Ankle plantarflexion    Ankle inversion    Ankle eversion     (Blank rows = not tested)  LOWER EXTREMITY MMT:    MMT Right eval Left eval  Hip flexion    Hip extension    Hip abduction    Hip adduction    Hip internal rotation    Hip external rotation    Knee flexion    Knee extension    Ankle dorsiflexion    Ankle plantarflexion    Ankle inversion    Ankle eversion     (Blank rows = not tested)     TREATMENT DATE: 04/22/24                                                                                                                               Manual therapy: Myofascial release to L>r quadratus lumborum, multifidus, lumbar paraspinalis and cleared posterior iliac crest border TherEx:  L SL hip abduction: from negative position with leg off EOB:3 x 10, 2lbs L  only Supine knee to chest: 20x 5 holds Supine piriformis stretch: 3 x 30 SL clamshells: 3 x 10 L only, 4lbs Supine hooklying, ab bracing with double knee to chest: 3 x 10    PATIENT EDUCATION:  Education details: see above Person educated: Patient Education method: Medical illustrator Education comprehension: verbalized understanding  HOME EXERCISE PROGRAM: Access Code: [email or web address] URL: [email or web address] Date: 04/22/2024 Prepared by: Raj Blanch  Exercises - Hooklying Single Knee to Chest  - 2-3 x daily - 7 x weekly - 20 reps - 5 hold - Supine Piriformis Stretch with Foot on Ground  - 2-3 x daily - 7 x weekly - 3 reps - 30 hold  ASSESSMENT:  CLINICAL IMPRESSION: Overall reporting improving pain since start of therapy. Pt tolerating progression of exercises well. Reports compliance with HEP. Soft tissue restrictions in Lumbar spine and gluts are improving.   OBJECTIVE IMPAIRMENTS: decreased activity tolerance, decreased endurance, decreased mobility, decreased ROM, decreased strength, hypomobility, increased fascial restrictions, increased muscle spasms, impaired flexibility, postural dysfunction, and pain.   ACTIVITY LIMITATIONS: carrying, lifting, bending, sitting, standing, squatting, and stairs  PARTICIPATION LIMITATIONS: meal prep, cleaning, laundry, driving, shopping, community activity, and occupation  PERSONAL FACTORS: Time since onset of injury/illness/exacerbation are also affecting patient's functional outcome.  REHAB POTENTIAL: Good  CLINICAL DECISION MAKING: Stable/uncomplicated  EVALUATION COMPLEXITY: Low   GOALS: Goals reviewed with patient? Yes  SHORT TERM GOALS: Target date: 05/20/2024    Patient will demo at least 50% compliance with HEP to self manage her symptoms.  Baseline: Initiated on 04/22/24 Goal status: INITIAL   LONG TERM GOALS: Target date: 06/17/2024    Patient will report 50% improvement in her L  lumbar radiculopathy symptoms to improve overall function. Baseline:  Goal status: INITIAL  2.  Patient will demo at least 10-15% (or 10 points) improvement in her modified oswestery score to improve all function  Baseline: 04/22/24: 37/50 = 74% disability Goal status: INITIAL  3.  Patient will be 100% Independent and compliant with managing her LBP symptoms to improve independent self management.  Baseline:  Goal status: INITIAL  4.  Patient will reports of LBP of <5/10 at worst to improve her overall function. Baseline: 10/10 Goal status: INITIAL   PLAN:  PT FREQUENCY: 2x/week  PT DURATION: 8 weeks  PLANNED INTERVENTIONS: 97164- PT Re-evaluation, 97750- Physical Performance Testing, 97110-Therapeutic exercises, 97530- Therapeutic activity, 97112- Neuromuscular re-education, 97535- Self Care, 02859- Manual therapy, [phone]- Gait training, [phone]- Aquatic Therapy, Patient/Family education, Balance training, Stair training, Joint mobilization, Joint manipulation, Spinal manipulation, Spinal mobilization, DME instructions, Cryotherapy, and Moist heat.  PLAN FOR NEXT SESSION: Review and progress HEP as tolerated   Raj LOISE Blanch, PT 04/29/2024, 9:31 AM

## 2024-05-02 ENCOUNTER — Ambulatory Visit

## 2024-05-02 DIAGNOSIS — M6281 Muscle weakness (generalized): Secondary | ICD-10-CM

## 2024-05-02 DIAGNOSIS — M5459 Other low back pain: Secondary | ICD-10-CM | POA: Diagnosis not present

## 2024-05-02 NOTE — Therapy (Signed)
 OUTPATIENT PHYSICAL THERAPY THORACOLUMBAR TREATMENT NOTE   Patient Name: Lori Acevedo MRN: [id number] DOB:[birth year], 58 y.o., female Today's Date: 05/02/2024  END OF SESSION:  PT End of Session - 05/02/24 1349     Visit Number 4    Number of Visits 13    Date for PT Re-Evaluation 06/03/24    Authorization Type [employer] Medicaid    PT Start Time 1235    PT Stop Time 1320    PT Time Calculation (min) 45 min    Activity Tolerance Patient tolerated treatment well    Behavior During Therapy WFL for tasks assessed/performed            Past Medical History:  Diagnosis Date   Hypertension    Past Surgical History:  Procedure Laterality Date   BREAST LUMPECTOMY Right 1991   cancer   CESAREAN SECTION  1990   UTERINE FIBROID SURGERY  2004   Patient Active Problem List   Diagnosis Date Noted   Rectal pain 08/20/2020    PCP: Dr. Ezekiel Charleston  REFERRING PROVIDER: [place] Ezekiel NOVAK, MD  REFERRING DIAG: [phone] (ICD-10-CM) - Lumbago with sciatica, left side G89.29 (ICD-10-CM) - Other chronic pain  Rationale for Evaluation and Treatment: Rehabilitation  THERAPY DIAG:  Other low back pain  Muscle weakness (generalized)  ONSET DATE: 04/17/2024- date of referral  SUBJECTIVE:                                                                                                                                                                                           SUBJECTIVE STATEMENT: Pt reports pain is about 5/10. I felt good after last session and I went home and did some cleaning and was feeling pain after. Overall pain and frequency of L hip pain is less.  PERTINENT HISTORY:  Past medical history is significant for hypertension, chronic back pain, breast cancer  PAIN:  Are you having pain? Yes: NPRS scale: 5/10 pain Pain location: LBP, left gluts, left leg Pain description: sharp, radiating, numbness, tingling Aggravating factors: Prolonged standing, walking,  sitting Relieving factors: lying  PRECAUTIONS: None  RED FLAGS: None   WEIGHT BEARING RESTRICTIONS: No  FALLS:  Has patient fallen in last 6 months? No   OCCUPATION: Works for [employer]  PLOF: Independent  PATIENT GOALS: Improve pain  NEXT MD VISIT:   OBJECTIVE:  Note: Objective measures were completed at Evaluation unless otherwise noted.  DIAGNOSTIC FINDINGS:  04/05/24: MRI L-spine IMPRESSION: Mild degenerative disc disease and moderate facet hypertrophy at L4-L5 with grade 1 anterolisthesis.  PATIENT SURVEYS:  Modified Oswestry 37/50   COGNITION: Overall cognitive status: Within functional  limits for tasks assessed     PALPATION: Hypersensitivity to touch in left lumbar spine around L4-5, L glut, bil greater torcahnters.  LUMBAR ROM:   AROM eval  Flexion 100% with pain when standing back up  Extension 100%  Right lateral flexion 100%  Left lateral flexion 100%  Right rotation   Left rotation    (Blank rows = not tested)  LOWER EXTREMITY ROM:     Active  Right eval Left eval  Hip flexion    Hip extension    Hip abduction    Hip adduction    Hip internal rotation    Hip external rotation    Knee flexion    Knee extension    Ankle dorsiflexion    Ankle plantarflexion    Ankle inversion    Ankle eversion     (Blank rows = not tested)  LOWER EXTREMITY MMT:    MMT Right eval Left eval  Hip flexion    Hip extension    Hip abduction    Hip adduction    Hip internal rotation    Hip external rotation    Knee flexion    Knee extension    Ankle dorsiflexion    Ankle plantarflexion    Ankle inversion    Ankle eversion     (Blank rows = not tested)     TREATMENT DATE:                                                                                                                                   Pt educated on proper management of pain with modificaion of functional activities at home. Pt educated that she should try  maintaining decreased level of pain that she experiences after physical therapy for as long as she can to avoid big spikes or highs/lows of the pain.  Manual therapy: Myofascial release to bil quadratus lumborum, multifidus, lumbar paraspinalis and cleared posterior iliac crest border  Pt offered ice at end of the session but denied. Pt educated on drinking some water after manual therapy to help with soreness. Pt educated on doing her stretches when she goes home.    PATIENT EDUCATION:  Education details: see above Person educated: Patient Education method: Medical illustrator Education comprehension: verbalized understanding  HOME EXERCISE PROGRAM: Access Code: [email or web address] URL: [email or web address] Date: 04/22/2024 Prepared by: Raj Blanch  Exercises - Hooklying Single Knee to Chest  - 2-3 x daily - 7 x weekly - 20 reps - 5 hold - Supine Piriformis Stretch with Foot on Ground  - 2-3 x daily - 7 x weekly - 3 reps - 30 hold  ASSESSMENT:  CLINICAL IMPRESSION: Pt reported improved pain levels at end fo the session. Will need further education on activity modification for improved self management of her pain. .   OBJECTIVE IMPAIRMENTS: decreased activity tolerance, decreased endurance, decreased mobility, decreased ROM, decreased strength, hypomobility, increased fascial restrictions, increased muscle  spasms, impaired flexibility, postural dysfunction, and pain.   ACTIVITY LIMITATIONS: carrying, lifting, bending, sitting, standing, squatting, and stairs  PARTICIPATION LIMITATIONS: meal prep, cleaning, laundry, driving, shopping, community activity, and occupation  PERSONAL FACTORS: Time since onset of injury/illness/exacerbation are also affecting patient's functional outcome.   REHAB POTENTIAL: Good  CLINICAL DECISION MAKING: Stable/uncomplicated  EVALUATION COMPLEXITY: Low   GOALS: Goals reviewed with patient? Yes  SHORT TERM GOALS: Target date:  05/20/2024    Patient will demo at least 50% compliance with HEP to self manage her symptoms.  Baseline: Initiated on 04/22/24 Goal status: INITIAL   LONG TERM GOALS: Target date: 06/17/2024    Patient will report 50% improvement in her L lumbar radiculopathy symptoms to improve overall function. Baseline:  Goal status: INITIAL  2.  Patient will demo at least 10-15% (or 10 points) improvement in her modified oswestery score to improve all function  Baseline: 04/22/24: 37/50 = 74% disability Goal status: INITIAL  3.  Patient will be 100% Independent and compliant with managing her LBP symptoms to improve independent self management.  Baseline:  Goal status: INITIAL  4.  Patient will reports of LBP of <5/10 at worst to improve her overall function. Baseline: 10/10 Goal status: INITIAL   PLAN:  PT FREQUENCY: 2x/week  PT DURATION: 8 weeks  PLANNED INTERVENTIONS: 97164- PT Re-evaluation, 97750- Physical Performance Testing, 97110-Therapeutic exercises, 97530- Therapeutic activity, 97112- Neuromuscular re-education, 97535- Self Care, 02859- Manual therapy, [phone]- Gait training, [phone]- Aquatic Therapy, Patient/Family education, Balance training, Stair training, Joint mobilization, Joint manipulation, Spinal manipulation, Spinal mobilization, DME instructions, Cryotherapy, and Moist heat.  PLAN FOR NEXT SESSION: Review and progress HEP as tolerated   Raj LOISE Blanch, PT 05/02/2024, 1:50 PM

## 2024-05-06 ENCOUNTER — Ambulatory Visit

## 2024-05-06 DIAGNOSIS — M6281 Muscle weakness (generalized): Secondary | ICD-10-CM

## 2024-05-06 DIAGNOSIS — M5459 Other low back pain: Secondary | ICD-10-CM

## 2024-05-06 NOTE — Therapy (Addendum)
 OUTPATIENT PHYSICAL THERAPY THORACOLUMBAR TREATMENT NOTE   Patient Name: Lori Acevedo MRN: [id number] DOB:01-14-1966, 58 y.o., female Today's Date: 05/06/2024  END OF SESSION:  PT End of Session - 05/06/24 1133     Visit Number 5    Number of Visits 13    Date for PT Re-Evaluation 06/03/24    Authorization Type [employer] Medicaid    PT Start Time 1135    PT Stop Time 1225    PT Time Calculation (min) 50 min    Activity Tolerance Patient tolerated treatment well    Behavior During Therapy WFL for tasks assessed/performed            Past Medical History:  Diagnosis Date   Hypertension    Past Surgical History:  Procedure Laterality Date   BREAST LUMPECTOMY Right 1991   cancer   CESAREAN SECTION  1990   UTERINE FIBROID SURGERY  2004   Patient Active Problem List   Diagnosis Date Noted   Rectal pain 08/20/2020    PCP: Dr. Ezekiel Charleston  REFERRING PROVIDER: Charleston Ezekiel NOVAK, MD  REFERRING DIAG: [phone] (ICD-10-CM) - Lumbago with sciatica, left side G89.29 (ICD-10-CM) - Other chronic pain  Rationale for Evaluation and Treatment: Rehabilitation  THERAPY DIAG:  Other low back pain  Muscle weakness (generalized)  ONSET DATE: 04/17/2024- date of referral  SUBJECTIVE:                                                                                                                                                                                           SUBJECTIVE STATEMENT: Pt reports pain is about 5/10. Bed mobility is getting easier. Had pain go down left leg on Saturday and frequency of this radicular symptoms down L LE is getting better.   PERTINENT HISTORY:  Past medical history is significant for hypertension, chronic back pain, breast cancer  PAIN:  Are you having pain? Yes: NPRS scale: 5/10 pain Pain location: LBP, left gluts, left leg Pain description: sharp, radiating, numbness, tingling Aggravating factors: Prolonged standing, walking,  sitting Relieving factors: lying  PRECAUTIONS: None  RED FLAGS: None   WEIGHT BEARING RESTRICTIONS: No  FALLS:  Has patient fallen in last 6 months? No   OCCUPATION: Works for [employer]  PLOF: Independent  PATIENT GOALS: Improve pain  NEXT MD VISIT:   OBJECTIVE:  Note: Objective measures were completed at Evaluation unless otherwise noted.  DIAGNOSTIC FINDINGS:  04/05/24: MRI L-spine IMPRESSION: Mild degenerative disc disease and moderate facet hypertrophy at L4-L5 with grade 1 anterolisthesis.  PATIENT SURVEYS:  Modified Oswestry 37/50   COGNITION: Overall cognitive status: Within functional limits for tasks  assessed     PALPATION: Hypersensitivity to touch in left lumbar spine around L4-5, L glut, bil greater torcahnters.  LUMBAR ROM:   AROM eval  Flexion 100% with pain when standing back up  Extension 100%  Right lateral flexion 100%  Left lateral flexion 100%  Right rotation   Left rotation    (Blank rows = not tested)  LOWER EXTREMITY ROM:     Active  Right eval Left eval  Hip flexion    Hip extension    Hip abduction    Hip adduction    Hip internal rotation    Hip external rotation    Knee flexion    Knee extension    Ankle dorsiflexion    Ankle plantarflexion    Ankle inversion    Ankle eversion     (Blank rows = not tested)  LOWER EXTREMITY MMT:    MMT Right eval Left eval  Hip flexion    Hip extension    Hip abduction    Hip adduction    Hip internal rotation    Hip external rotation    Knee flexion    Knee extension    Ankle dorsiflexion    Ankle plantarflexion    Ankle inversion    Ankle eversion     (Blank rows = not tested)     TREATMENT DATE:                                                                                                                                   Pt educated on proper management of pain with modificaion of functional activities at home. Pt educated that she should try  maintaining decreased level of pain that she experiences after physical therapy for as long as she can to avoid big spikes or highs/lows of the pain.  Manual therapy: Myofascial release to bil quadratus lumborum, multifidus, lumbar paraspinalis and cleared posterior iliac crest border, L gluts, L sacral sulcus, L piriformis and glut max region  SL clamshells: manually resisted: 3 x 10 L and R SL hip abduction: 2lbs 3 x 10 R and L Supine hooklying isometric hip adduction: pillow: 10 x 5 holds Supine hooklying hip abduction: black band: 3 x 10- cues to improve mm activation around SI joint bil (focusing on smaller ROM) Supine bridge: 2 x 10 Supine piriformis stretch: 3 x 30 R and L  Pt educated on managing her pain to best of her ability <5/10 and discussed our goal will be to improve her average pain to be <5/10 as she has been reporting 5/10 consistently over last few sessions.    PATIENT EDUCATION:  Education details: see above Person educated: Patient Education method: Medical illustrator Education comprehension: verbalized understanding  HOME EXERCISE PROGRAM: Access Code: [email or web address] URL: [email or web address] Date: 04/22/2024 Prepared by: Raj Blanch  Exercises - Hooklying Single Knee to Chest  - 2-3 x daily - 7 x weekly -  20 reps - 5 hold - Supine Piriformis Stretch with Foot on Ground  - 2-3 x daily - 7 x weekly - 3 reps - 30 hold  Access Code: [email or web address] URL: [email or web address] Date: 05/06/2024 Prepared by: Raj Blanch  Exercises - Hooklying Single Knee to Chest  - 2-3 x daily - 7 x weekly - 20 reps - 5 hold - Supine Piriformis Stretch with Foot on Ground  - 2-3 x daily - 7 x weekly - 3 reps - 30 hold - Sidelying Hip Abduction  - 1 x daily - 7 x weekly - 2 sets - 10 reps - Supine Hip Adduction Isometric with Ball  - 1 x daily - 7 x weekly - 2 sets - 10 reps - Supine Bridge  - 1 x daily - 7 x weekly - 2 sets - 10 reps -  Hooklying Isometric Clamshell  - 1 x daily - 7 x weekly - 2 sets - 10 reps  ASSESSMENT:  CLINICAL IMPRESSION: Pt reporting gradual pain relief with daily activities, transfers, bed mobility with decreasing frequency of radicular symptoms. Updated HEP to incorporate strengthening stabilization.  OBJECTIVE IMPAIRMENTS: decreased activity tolerance, decreased endurance, decreased mobility, decreased ROM, decreased strength, hypomobility, increased fascial restrictions, increased muscle spasms, impaired flexibility, postural dysfunction, and pain.   ACTIVITY LIMITATIONS: carrying, lifting, bending, sitting, standing, squatting, and stairs  PARTICIPATION LIMITATIONS: meal prep, cleaning, laundry, driving, shopping, community activity, and occupation  PERSONAL FACTORS: Time since onset of injury/illness/exacerbation are also affecting patient's functional outcome.   REHAB POTENTIAL: Good  CLINICAL DECISION MAKING: Stable/uncomplicated  EVALUATION COMPLEXITY: Low   GOALS: Goals reviewed with patient? Yes  SHORT TERM GOALS: Target date: 05/20/2024    Patient will demo at least 50% compliance with HEP to self manage her symptoms.  Baseline: Initiated on 04/22/24 Goal status: Goal met   LONG TERM GOALS: Target date: 06/17/2024    Patient will report 50% improvement in her L lumbar radiculopathy symptoms to improve overall function. Baseline: 5% improved by patient (05/06/24) Goal status: Progressing  2.  Patient will demo at least 10-15% (or 10 points) improvement in her modified oswestery score to improve all function  Baseline: 04/22/24: 37/50 = 74% disability Goal status: INITIAL  3.  Patient will be 100% Independent and compliant with managing her LBP symptoms to improve independent self management.  Baseline:  Goal status: Progressing  4.  Patient will reports of LBP of <5/10 at worst to improve her overall function. Baseline: 10/10 Goal status: INITIAL   PLAN:  PT  FREQUENCY: 2x/week  PT DURATION: 8 weeks  PLANNED INTERVENTIONS: 97164- PT Re-evaluation, 97750- Physical Performance Testing, 97110-Therapeutic exercises, 97530- Therapeutic activity, 97112- Neuromuscular re-education, 97535- Self Care, [id number]- Manual therapy, [phone]- Gait training, [phone]- Aquatic Therapy, Patient/Family education, Balance training, Stair training, Joint mobilization, Joint manipulation, Spinal manipulation, Spinal mobilization, DME instructions, Cryotherapy, and Moist heat.  PLAN FOR NEXT SESSION: Review and progress HEP as tolerated   Raj LOISE Blanch, PT 05/06/2024, 12:27 PM

## 2024-05-13 ENCOUNTER — Ambulatory Visit: Attending: Internal Medicine

## 2024-05-13 DIAGNOSIS — M6281 Muscle weakness (generalized): Secondary | ICD-10-CM | POA: Diagnosis present

## 2024-05-13 DIAGNOSIS — M5459 Other low back pain: Secondary | ICD-10-CM | POA: Diagnosis present

## 2024-05-13 NOTE — Therapy (Signed)
 OUTPATIENT PHYSICAL THERAPY THORACOLUMBAR TREATMENT NOTE   Patient Name: Lori Acevedo MRN: [id number] DOB:1966/07/12, 58 y.o., female Today's Date: 05/13/2024  END OF SESSION:  PT End of Session - 05/13/24 0929     Visit Number 6    Number of Visits 13    Date for PT Re-Evaluation 06/03/24    Authorization Type [employer] Medicaid    PT Start Time 0930    PT Stop Time 1030    PT Time Calculation (min) 60 min    Activity Tolerance Patient tolerated treatment well    Behavior During Therapy WFL for tasks assessed/performed             Past Medical History:  Diagnosis Date   Hypertension    Past Surgical History:  Procedure Laterality Date   BREAST LUMPECTOMY Right 1991   cancer   CESAREAN SECTION  1990   UTERINE FIBROID SURGERY  2004   Patient Active Problem List   Diagnosis Date Noted   Rectal pain 08/20/2020    PCP: Dr. Ezekiel Charleston  REFERRING PROVIDER: [place] Ezekiel NOVAK, MD  REFERRING DIAG: [phone] (ICD-10-CM) - Lumbago with sciatica, left side G89.29 (ICD-10-CM) - Other chronic pain  Rationale for Evaluation and Treatment: Rehabilitation  THERAPY DIAG:  Other low back pain  Muscle weakness (generalized)  ONSET DATE: 04/17/2024- date of referral  SUBJECTIVE:                                                                                                                                                                                           SUBJECTIVE STATEMENT: Pt reports currently she is not in pain but she felt pain intermittently over the weekend. When pain came it was 10/10. She still reports of spasms going down L leg.  PERTINENT HISTORY:  Past medical history is significant for hypertension, chronic back pain, breast cancer  PAIN:  Are you having pain? Yes: NPRS scale: 0/10 pain Pain location: LBP, left gluts, left leg Pain description: sharp, radiating, numbness, tingling Aggravating factors: Prolonged standing, walking,  sitting Relieving factors: lying  PRECAUTIONS: None  RED FLAGS: None   WEIGHT BEARING RESTRICTIONS: No  FALLS:  Has patient fallen in last 6 months? No   OCCUPATION: Works for [employer]  PLOF: Independent  PATIENT GOALS: Improve pain  NEXT MD VISIT:   OBJECTIVE:  Note: Objective measures were completed at Evaluation unless otherwise noted.  DIAGNOSTIC FINDINGS:  04/05/24: MRI L-spine IMPRESSION: Mild degenerative disc disease and moderate facet hypertrophy at L4-L5 with grade 1 anterolisthesis.  PATIENT SURVEYS:  Modified Oswestry 37/50   COGNITION: Overall cognitive status: Within functional limits for tasks  assessed     PALPATION: Hypersensitivity to touch in left lumbar spine around L4-5, L glut, bil greater torcahnters.  LUMBAR ROM:   AROM eval  Flexion 100% with pain when standing back up  Extension 100%  Right lateral flexion 100%  Left lateral flexion 100%  Right rotation   Left rotation    (Blank rows = not tested)  LOWER EXTREMITY ROM:     Active  Right eval Left eval  Hip flexion    Hip extension    Hip abduction    Hip adduction    Hip internal rotation    Hip external rotation    Knee flexion    Knee extension    Ankle dorsiflexion    Ankle plantarflexion    Ankle inversion    Ankle eversion     (Blank rows = not tested)  LOWER EXTREMITY MMT:    MMT Right eval Left eval  Hip flexion    Hip extension    Hip abduction    Hip adduction    Hip internal rotation    Hip external rotation    Knee flexion    Knee extension    Ankle dorsiflexion    Ankle plantarflexion    Ankle inversion    Ankle eversion     (Blank rows = not tested)     TREATMENT DATE:                                                                                                                                  ain that she experiences after physical therapy for as long as she can to avoid big spikes or highs/lows of the pain.  Manual  therapy: Myofascial release to bil quadratus lumborum, multifidus, lumbar paraspinalis and cleared posterior iliac crest border, L gluts, L sacral sulcus, L piriformis and glut max region SL clamshells: manually resisted: 3 x 10 L and R SL hip abduction: 3 x 15   R and L Single knee to chest: 5 x 10 sec holds R and L Supine hooklying isometric hip abduction: belt: 10 x 5 holds Supine bridge: 2 x 10 Supine hooklying double knee to chest with eccentric slow lowering: 3 x 10    PATIENT EDUCATION:  Education details: see above Person educated: Patient Education method: Medical illustrator Education comprehension: verbalized understanding  HOME EXERCISE PROGRAM: Access Code: [email or web address] URL: [email or web address] Date: 04/22/2024 Prepared by: Raj Blanch  Exercises - Hooklying Single Knee to Chest  - 2-3 x daily - 7 x weekly - 20 reps - 5 hold - Supine Piriformis Stretch with Foot on Ground  - 2-3 x daily - 7 x weekly - 3 reps - 30 hold  Access Code: [email or web address] URL: [email or web address] Date: 05/13/2024 Prepared by: Raj Blanch  Exercises - Hooklying Single Knee to Chest  - 2-3 x daily - 7 x weekly - 20 reps - 5 hold - Supine Piriformis Stretch with Foot  on Ground  - 2-3 x daily - 7 x weekly - 3 reps - 30 hold - Sidelying Hip Abduction  - 1 x daily - 7 x weekly - 2 sets - 10 reps - Supine Hip Adduction Isometric with Ball  - 1 x daily - 7 x weekly - 2 sets - 10 reps - Supine Bridge  - 1 x daily - 7 x weekly - 2 sets - 10 reps - Hooklying Isometric Clamshell  - 1 x daily - 7 x weekly - 2 sets - 10 reps - Supine Double Bent Leg Lift  - 1 x daily - 7 x weekly - 3 sets - 10 reps  Access Code: [email or web address] URL: [email or web address] Date: 05/06/2024 Prepared by: Raj Blanch  Exercises - Hooklying Single Knee to Chest  - 2-3 x daily - 7 x weekly - 20 reps - 5 hold - Supine Piriformis Stretch with Foot on Ground  - 2-3 x daily - 7  x weekly - 3 reps - 30 hold - Sidelying Hip Abduction  - 1 x daily - 7 x weekly - 2 sets - 10 reps - Supine Hip Adduction Isometric with Ball  - 1 x daily - 7 x weekly - 2 sets - 10 reps - Supine Bridge  - 1 x daily - 7 x weekly - 2 sets - 10 reps - Hooklying Isometric Clamshell  - 1 x daily - 7 x weekly - 2 sets - 10 reps  ASSESSMENT:  CLINICAL IMPRESSION: Today's session focused on gradual progression of exercises and manual therapy. Pt had small set back over the weekend with increased in her pain due to increased activity due to holidays. Pt was reporting 0/10 pain when she came in today which has improved pain. Progressed HEP today as well.  OBJECTIVE IMPAIRMENTS: decreased activity tolerance, decreased endurance, decreased mobility, decreased ROM, decreased strength, hypomobility, increased fascial restrictions, increased muscle spasms, impaired flexibility, postural dysfunction, and pain.   ACTIVITY LIMITATIONS: carrying, lifting, bending, sitting, standing, squatting, and stairs  PARTICIPATION LIMITATIONS: meal prep, cleaning, laundry, driving, shopping, community activity, and occupation  PERSONAL FACTORS: Time since onset of injury/illness/exacerbation are also affecting patient's functional outcome.   REHAB POTENTIAL: Good  CLINICAL DECISION MAKING: Stable/uncomplicated  EVALUATION COMPLEXITY: Low   GOALS: Goals reviewed with patient? Yes  SHORT TERM GOALS: Target date: 05/20/2024    Patient will demo at least 50% compliance with HEP to self manage her symptoms.  Baseline: Initiated on 04/22/24 Goal status: Goal met   LONG TERM GOALS: Target date: 06/17/2024    Patient will report 50% improvement in her L lumbar radiculopathy symptoms to improve overall function. Baseline: 5% improved by patient (05/06/24) Goal status: Progressing  2.  Patient will demo at least 10-15% (or 10 points) improvement in her modified oswestery score to improve all function  Baseline:  04/22/24: 37/50 = 74% disability Goal status: INITIAL  3.  Patient will be 100% Independent and compliant with managing her LBP symptoms to improve independent self management.  Baseline:  Goal status: Progressing  4.  Patient will reports of LBP of <5/10 at worst to improve her overall function. Baseline: 10/10 Goal status: INITIAL   PLAN:  PT FREQUENCY: 2x/week  PT DURATION: 8 weeks  PLANNED INTERVENTIONS: 97164- PT Re-evaluation, 97750- Physical Performance Testing, 97110-Therapeutic exercises, 97530- Therapeutic activity, [id number]- Neuromuscular re-education, 97535- Self Care, 02859- Manual therapy, [phone]- Gait training, [phone]- Aquatic Therapy, Patient/Family education, Balance training, Stair training, Joint mobilization, Joint manipulation, Spinal manipulation, Spinal  mobilization, DME instructions, Cryotherapy, and Moist heat.  PLAN FOR NEXT SESSION: Review and progress HEP as tolerated   Raj LOISE Blanch, PT 05/13/2024, 10:36 AM

## 2024-05-15 ENCOUNTER — Ambulatory Visit

## 2024-05-15 DIAGNOSIS — M6281 Muscle weakness (generalized): Secondary | ICD-10-CM

## 2024-05-15 DIAGNOSIS — M5459 Other low back pain: Secondary | ICD-10-CM | POA: Diagnosis not present

## 2024-05-15 NOTE — Therapy (Signed)
 OUTPATIENT PHYSICAL THERAPY THORACOLUMBAR TREATMENT NOTE   Patient Name: Lori Acevedo MRN: [id number] DOB:1965-12-22, 58 y.o., female Today's Date: 05/15/2024  END OF SESSION:  PT End of Session - 05/15/24 0919     Visit Number 7    Number of Visits 13    Date for PT Re-Evaluation 06/03/24    Authorization Type [employer] Medicaid    PT Start Time 0845    PT Stop Time 0930    PT Time Calculation (min) 45 min    Activity Tolerance Patient tolerated treatment well    Behavior During Therapy WFL for tasks assessed/performed             Past Medical History:  Diagnosis Date   Hypertension    Past Surgical History:  Procedure Laterality Date   BREAST LUMPECTOMY Right 1991   cancer   CESAREAN SECTION  1990   UTERINE FIBROID SURGERY  2004   Patient Active Problem List   Diagnosis Date Noted   Rectal pain 08/20/2020    PCP: Dr. Ezekiel [place]  REFERRING PROVIDER: [place] Ezekiel NOVAK, MD  REFERRING DIAG: [phone] (ICD-10-CM) - Lumbago with sciatica, left side G89.29 (ICD-10-CM) - Other chronic pain  Rationale for Evaluation and Treatment: Rehabilitation  THERAPY DIAG:  Other low back pain  Muscle weakness (generalized)  ONSET DATE: 04/17/2024- date of referral  SUBJECTIVE:                                                                                                                                                                                           SUBJECTIVE STATEMENT: Pt reports she woke up today and had 10/10 pain going down to left foot today. Currently pain is the same. Pt reports no improvement from HEP or overall. Pt reports compliance with HEP  PERTINENT HISTORY:  Past medical history is significant for hypertension, chronic back pain, breast cancer  PAIN:  Are you having pain? Yes: NPRS scale: 10/10 pain Pain location: LBP, left gluts, left leg Pain description: sharp, radiating, numbness, tingling Aggravating factors: Prolonged standing,  walking, sitting Relieving factors: lying  PRECAUTIONS: None  RED FLAGS: None   WEIGHT BEARING RESTRICTIONS: No  FALLS:  Has patient fallen in last 6 months? No   OCCUPATION: Works for [employer]  PLOF: Independent  PATIENT GOALS: Improve pain  NEXT MD VISIT:   OBJECTIVE:  Note: Objective measures were completed at Evaluation unless otherwise noted.  DIAGNOSTIC FINDINGS:  04/05/24: MRI L-spine IMPRESSION: Mild degenerative disc disease and moderate facet hypertrophy at L4-L5 with grade 1 anterolisthesis.  PATIENT SURVEYS:  Modified Oswestry 37/50   COGNITION: Overall cognitive status: Within functional  limits for tasks assessed     PALPATION: Hypersensitivity to touch in left lumbar spine around L4-5, L glut, bil greater torcahnters.  LUMBAR ROM:   AROM eval  Flexion 100% with pain when standing back up  Extension 100%  Right lateral flexion 100%  Left lateral flexion 100%  Right rotation   Left rotation    (Blank rows = not tested)  LOWER EXTREMITY ROM:     Active  Right eval Left eval  Hip flexion    Hip extension    Hip abduction    Hip adduction    Hip internal rotation    Hip external rotation    Knee flexion    Knee extension    Ankle dorsiflexion    Ankle plantarflexion    Ankle inversion    Ankle eversion     (Blank rows = not tested)  LOWER EXTREMITY MMT:    MMT Right eval Left eval  Hip flexion    Hip extension    Hip abduction    Hip adduction    Hip internal rotation    Hip external rotation    Knee flexion    Knee extension    Ankle dorsiflexion    Ankle plantarflexion    Ankle inversion    Ankle eversion     (Blank rows = not tested)     TREATMENT DATE:                                                                                                                                  Manual therapy: Myofascial release to bil quadratus lumborum, multifidus, lumbar paraspinalis and cleared posterior iliac  crest border, L gluts, L sacral sulcus, L piriformis and glut max region Foam roll massage to L IT band, L gluts and L peroneals  SL clamshells: manually resisted: 3 x 10 L only SL hip abduction: 3 x 15  L only  Pt offered hot pack at end o the session but patient had to leave.  PATIENT EDUCATION:  Education details: see above Person educated: Patient Education method: Medical illustrator Education comprehension: verbalized understanding  HOME EXERCISE PROGRAM: Access Code: [email or web address] URL: [email or web address] Date: 04/22/2024 Prepared by: Raj Blanch  Exercises - Hooklying Single Knee to Chest  - 2-3 x daily - 7 x weekly - 20 reps - 5 hold - Supine Piriformis Stretch with Foot on Ground  - 2-3 x daily - 7 x weekly - 3 reps - 30 hold  Access Code: [email or web address] URL: [email or web address] Date: 05/13/2024 Prepared by: Raj Blanch  Exercises - Hooklying Single Knee to Chest  - 2-3 x daily - 7 x weekly - 20 reps - 5 hold - Supine Piriformis Stretch with Foot on Ground  - 2-3 x daily - 7 x weekly - 3 reps - 30 hold - Sidelying Hip Abduction  - 1 x daily - 7 x weekly - 2 sets - 10 reps -  Supine Hip Adduction Isometric with Ball  - 1 x daily - 7 x weekly - 2 sets - 10 reps - Supine Bridge  - 1 x daily - 7 x weekly - 2 sets - 10 reps - Hooklying Isometric Clamshell  - 1 x daily - 7 x weekly - 2 sets - 10 reps - Supine Double Bent Leg Lift  - 1 x daily - 7 x weekly - 3 sets - 10 reps  Access Code: [email or web address] URL: [email or web address] Date: 05/06/2024 Prepared by: Raj Blanch  Exercises - Hooklying Single Knee to Chest  - 2-3 x daily - 7 x weekly - 20 reps - 5 hold - Supine Piriformis Stretch with Foot on Ground  - 2-3 x daily - 7 x weekly - 3 reps - 30 hold - Sidelying Hip Abduction  - 1 x daily - 7 x weekly - 2 sets - 10 reps - Supine Hip Adduction Isometric with Ball  - 1 x daily - 7 x weekly - 2 sets - 10 reps - Supine  Bridge  - 1 x daily - 7 x weekly - 2 sets - 10 reps - Hooklying Isometric Clamshell  - 1 x daily - 7 x weekly - 2 sets - 10 reps  ASSESSMENT:  CLINICAL IMPRESSION: Today's session focused on manual therapy to improve her pain. Pt is not reporting overall improvement in pain.  OBJECTIVE IMPAIRMENTS: decreased activity tolerance, decreased endurance, decreased mobility, decreased ROM, decreased strength, hypomobility, increased fascial restrictions, increased muscle spasms, impaired flexibility, postural dysfunction, and pain.   ACTIVITY LIMITATIONS: carrying, lifting, bending, sitting, standing, squatting, and stairs  PARTICIPATION LIMITATIONS: meal prep, cleaning, laundry, driving, shopping, community activity, and occupation  PERSONAL FACTORS: Time since onset of injury/illness/exacerbation are also affecting patient's functional outcome.   REHAB POTENTIAL: Good  CLINICAL DECISION MAKING: Stable/uncomplicated  EVALUATION COMPLEXITY: Low   GOALS: Goals reviewed with patient? Yes  SHORT TERM GOALS: Target date: 05/20/2024    Patient will demo at least 50% compliance with HEP to self manage her symptoms.  Baseline: Initiated on 04/22/24 Goal status: Goal met   LONG TERM GOALS: Target date: 06/17/2024    Patient will report 50% improvement in her L lumbar radiculopathy symptoms to improve overall function. Baseline: 5% improved by patient (05/06/24) Goal status: Progressing  2.  Patient will demo at least 10-15% (or 10 points) improvement in her modified oswestery score to improve all function  Baseline: 04/22/24: 37/50 = 74% disability Goal status: INITIAL  3.  Patient will be 100% Independent and compliant with managing her LBP symptoms to improve independent self management.  Baseline:  Goal status: Progressing  4.  Patient will reports of LBP of <5/10 at worst to improve her overall function. Baseline: 10/10 Goal status: INITIAL   PLAN:  PT FREQUENCY:  2x/week  PT DURATION: 8 weeks  PLANNED INTERVENTIONS: 97164- PT Re-evaluation, 97750- Physical Performance Testing, 97110-Therapeutic exercises, 97530- Therapeutic activity, 97112- Neuromuscular re-education, 97535- Self Care, 02859- Manual therapy, [phone]- Gait training, [phone]- Aquatic Therapy, Patient/Family education, Balance training, Stair training, Joint mobilization, Joint manipulation, Spinal manipulation, Spinal mobilization, DME instructions, Cryotherapy, and Moist heat.  PLAN FOR NEXT SESSION: Review and progress HEP as tolerated   Raj LOISE Blanch, PT 05/15/2024, 9:44 AM

## 2024-05-20 ENCOUNTER — Ambulatory Visit

## 2024-05-22 ENCOUNTER — Ambulatory Visit

## 2024-05-22 DIAGNOSIS — M5459 Other low back pain: Secondary | ICD-10-CM | POA: Diagnosis not present

## 2024-05-22 DIAGNOSIS — M6281 Muscle weakness (generalized): Secondary | ICD-10-CM

## 2024-05-22 NOTE — Therapy (Signed)
 OUTPATIENT PHYSICAL THERAPY THORACOLUMBAR TREATMENT NOTE  PHYSICAL THERAPY DISCHARGE SUMMARY  Visits from Start of Care: 9  Current functional level related to goals / functional outcomes:    Remaining deficits: Pain, lumbar radiculopathy   Education / Equipment: HEP   Patient agrees to discharge. Patient goals were partially met. Patient is being discharged due to lack of progress.   Patient Name: Lori Acevedo MRN: [id number] DOB:1966/02/07, 58 y.o., female Today's Date: 05/22/2024  END OF SESSION:  PT End of Session - 05/22/24 0931     Visit Number 8    Number of Visits 13    Date for PT Re-Evaluation 06/03/24    Authorization Type [employer] Medicaid    PT Start Time 0930    PT Stop Time 1015    PT Time Calculation (min) 45 min    Activity Tolerance Patient tolerated treatment well    Behavior During Therapy WFL for tasks assessed/performed              Past Medical History:  Diagnosis Date   Hypertension    Past Surgical History:  Procedure Laterality Date   BREAST LUMPECTOMY Right 1991   cancer   CESAREAN SECTION  1990   UTERINE FIBROID SURGERY  2004   Patient Active Problem List   Diagnosis Date Noted   Rectal pain 08/20/2020    PCP: Dr. Ezekiel Charleston  REFERRING PROVIDER: [place] Ezekiel NOVAK, MD  REFERRING DIAG: [phone] (ICD-10-CM) - Lumbago with sciatica, left side G89.29 (ICD-10-CM) - Other chronic pain  Rationale for Evaluation and Treatment: Rehabilitation  THERAPY DIAG:  Other low back pain  Muscle weakness (generalized)  ONSET DATE: 04/17/2024- date of referral  SUBJECTIVE:                                                                                                                                                                                           SUBJECTIVE STATEMENT: Currently pain is 4-5/10. At home she does get intermittent 10/10 pain that shoots down L LE. She has appt with rhematology. Pt reports no long term  benefits of PT in pain.  PERTINENT HISTORY:  Past medical history is significant for hypertension, chronic back pain, breast cancer  PAIN:  Are you having pain? Yes: NPRS scale: 4-5/10 pain Pain location: LBP, left gluts, left leg Pain description: sharp, radiating, numbness, tingling Aggravating factors: Prolonged standing, walking, sitting Relieving factors: lying  PRECAUTIONS: None  RED FLAGS: None   WEIGHT BEARING RESTRICTIONS: No  FALLS:  Has patient fallen in last 6 months? No   OCCUPATION: Works for [employer]  PLOF: Independent  PATIENT GOALS: Improve pain  NEXT MD VISIT:   OBJECTIVE:  Note: Objective measures were completed at Evaluation unless otherwise noted.  DIAGNOSTIC FINDINGS:  04/05/24: MRI L-spine IMPRESSION: Mild degenerative disc disease and moderate facet hypertrophy at L4-L5 with grade 1 anterolisthesis.  PATIENT SURVEYS:  Modified Oswestry 37/50   COGNITION: Overall cognitive status: Within functional limits for tasks assessed     PALPATION: Hypersensitivity to touch in left lumbar spine around L4-5, L glut, bil greater torcahnters.  LUMBAR ROM:   AROM eval  Flexion 100% with pain when standing back up  Extension 100%  Right lateral flexion 100%  Left lateral flexion 100%  Right rotation   Left rotation    (Blank rows = not tested)  LOWER EXTREMITY ROM:     Active  Right eval Left eval  Hip flexion    Hip extension    Hip abduction    Hip adduction    Hip internal rotation    Hip external rotation    Knee flexion    Knee extension    Ankle dorsiflexion    Ankle plantarflexion    Ankle inversion    Ankle eversion     (Blank rows = not tested)  LOWER EXTREMITY MMT:    MMT Right eval Left eval  Hip flexion    Hip extension    Hip abduction    Hip adduction    Hip internal rotation    Hip external rotation    Knee flexion    Knee extension    Ankle dorsiflexion    Ankle plantarflexion    Ankle  inversion    Ankle eversion     (Blank rows = not tested)     TREATMENT DATE:                                                                                                                                  Manual therapy: Myofascial release to bil quadratus lumborum, multifidus, lumbar paraspinalis and cleared posterior iliac crest border,bl spinous processes  MODIFIED OSWESTRY DISABILITY SCALE  Date: 05/22/24 Score  Pain intensity 5 =  Pain medication has no effect on my pain.  2. Personal care (washing, dressing, etc.) 1 =  I can take care of myself normally, but it increases my pain.  3. Lifting 4 = I can lift only very light weights  4. Walking 2 =  Pain prevents me from walking more than  mile.  5. Sitting 1 =  I can only sit in my favorite chair as long as I like.  6. Standing 1 =  I can stand as long as I want but, it increases my pain.  7. Sleeping 3 =  Even when I take pain medication, I sleep less than 4 hours.  8. Social Life 2 = Pain prevents me from participating in more energetic activities (eg. sports, dancing).  9. Traveling 2 =  My pain restricts my travel over 2 hours.  10. Employment/ Homemaking 1 = My normal homemaking/job activities increase my pain, but I can still perform all that is required of me  Total 22/50   Interpretation of scores: Score Category Description  0-20% Minimal Disability The patient can cope with most living activities. Usually no treatment is indicated apart from advice on lifting, sitting and exercise  21-40% Moderate Disability The patient experiences more pain and difficulty with sitting, lifting and standing. Travel and social life are more difficult and they may be disabled from work. Personal care, sexual activity and sleeping are not grossly affected, and the patient can usually be managed by conservative means  41-60% Severe Disability Pain remains the main problem in this group, but activities of daily living are affected. These  patients require a detailed investigation  61-80% Crippled Back pain impinges on all aspects of the patient's life. Positive intervention is required  81-100% Bed-bound  These patients are either bed-bound or exaggerating their symptoms  Bluford FORBES Zoe DELENA Karon DELENA, et al. Surgery versus conservative management of stable thoracolumbar fracture: the PRESTO feasibility RCT. [place] ([place]): [employer]; 2021 Nov. [facility] Technology Assessment, No. 25.62.) Appendix 3, Oswestry Disability Index category descriptors. Available from: FindJewel[employer].cz  Minimally Clinically Important Difference (MCID) = 12.8%  PATIENT EDUCATION:  Education details: see above Person educated: Patient Education method: Medical illustrator Education comprehension: verbalized understanding  HOME EXERCISE PROGRAM: Access Code: [email or web address] URL: [email or web address] Date: 04/22/2024 Prepared by: Raj Blanch  Exercises - Hooklying Single Knee to Chest  - 2-3 x daily - 7 x weekly - 20 reps - 5 hold - Supine Piriformis Stretch with Foot on Ground  - 2-3 x daily - 7 x weekly - 3 reps - 30 hold  Access Code: [email or web address] URL: [email or web address] Date: 05/13/2024 Prepared by: Raj Blanch  Exercises - Hooklying Single Knee to Chest  - 2-3 x daily - 7 x weekly - 20 reps - 5 hold - Supine Piriformis Stretch with Foot on Ground  - 2-3 x daily - 7 x weekly - 3 reps - 30 hold - Sidelying Hip Abduction  - 1 x daily - 7 x weekly - 2 sets - 10 reps - Supine Hip Adduction Isometric with Ball  - 1 x daily - 7 x weekly - 2 sets - 10 reps - Supine Bridge  - 1 x daily - 7 x weekly - 2 sets - 10 reps - Hooklying Isometric Clamshell  - 1 x daily - 7 x weekly - 2 sets - 10 reps - Supine Double Bent Leg Lift  - 1 x daily - 7 x weekly - 3 sets - 10 reps  Access Code: [email or web address] URL: [email or web address] Date: 05/06/2024 Prepared by: Raj Blanch  Exercises - Hooklying Single Knee to Chest  - 2-3 x daily - 7 x weekly - 20 reps - 5 hold - Supine Piriformis Stretch with Foot on Ground  - 2-3 x daily - 7 x weekly - 3 reps - 30 hold - Sidelying Hip Abduction  - 1 x daily - 7 x weekly - 2 sets - 10 reps - Supine Hip Adduction Isometric with Ball  - 1 x daily - 7 x weekly - 2 sets - 10 reps - Supine Bridge  - 1 x daily - 7 x weekly - 2 sets - 10 reps - Hooklying Isometric Clamshell  - 1 x daily - 7 x weekly - 2 sets - 10 reps  ASSESSMENT:  CLINICAL IMPRESSION: Patient has been seen  for total of 9 sessions for chronic back pain with lumbar radiculopathy. Patient has reported mild improvement in pain but no significant improvement in lumbar radiculopathy. Patient has made partial progress towards her long term goals. Due to lack of progress and significant improvement despite manual therapy, progressive exercises and compliance with HEP, patient   OBJECTIVE IMPAIRMENTS: decreased activity tolerance, decreased endurance, decreased mobility, decreased ROM, decreased strength, hypomobility, increased fascial restrictions, increased muscle spasms, impaired flexibility, postural dysfunction, and pain.   ACTIVITY LIMITATIONS: carrying, lifting, bending, sitting, standing, squatting, and stairs  PARTICIPATION LIMITATIONS: meal prep, cleaning, laundry, driving, shopping, community activity, and occupation  PERSONAL FACTORS: Time since onset of injury/illness/exacerbation are also affecting patient's functional outcome.   REHAB POTENTIAL: Good  CLINICAL DECISION MAKING: Stable/uncomplicated  EVALUATION COMPLEXITY: Low   GOALS: Goals reviewed with patient? Yes  SHORT TERM GOALS: Target date: 05/20/2024    Patient will demo at least 50% compliance with HEP to self manage her symptoms.  Baseline: Initiated on 04/22/24 Goal status: Goal met   LONG TERM GOALS: Target date: 06/17/2024    Patient will report 50% improvement in her L  lumbar radiculopathy symptoms to improve overall function. Baseline: 5% improved by patient (05/06/24); no improvement (05/22/24) Goal status: Not met  2.  Patient will demo at least 10-15% (or 10 points) improvement in her modified oswestery score to improve all function  Baseline: 04/22/24: 37/50 = 74% disability; 05/22/24: 22/50 44% disability Goal status: Goal met  3.  Patient will be 100% Independent and compliant with managing her LBP symptoms to improve independent self management.  Baseline:  Goal status: Goal met  4.  Patient will reports of LBP of <5/10 at worst to improve her overall function. Baseline: 10/10 Goal status: Not met   PLAN:D/C from skilled PT   Raj LOISE Blanch, PT 05/22/2024, 10:19 AM

## 2024-07-30 ENCOUNTER — Encounter: Admitting: Internal Medicine

## 2024-07-31 ENCOUNTER — Other Ambulatory Visit: Payer: Self-pay | Admitting: Internal Medicine

## 2024-07-31 DIAGNOSIS — Z1231 Encounter for screening mammogram for malignant neoplasm of breast: Secondary | ICD-10-CM

## 2024-08-13 ENCOUNTER — Ambulatory Visit
Admission: RE | Admit: 2024-08-13 | Discharge: 2024-08-13 | Disposition: A | Discharge status: Home / Self Care | Source: Ambulatory Visit | Attending: Internal Medicine | Admitting: Internal Medicine

## 2024-08-13 DIAGNOSIS — Z1231 Encounter for screening mammogram for malignant neoplasm of breast: Secondary | ICD-10-CM
